# Patient Record
Sex: Female | Born: 1960 | Race: White | Hispanic: No | Marital: Married | Smoking: Never smoker
Health system: Southern US, Community
[De-identification: ages and names within clinical notes are randomized; demographics above are authoritative.]

## PROBLEM LIST (undated history)

## (undated) HISTORY — PX: BREAST BIOPSY: SHX20

---

## 1998-12-13 ENCOUNTER — Other Ambulatory Visit: Admission: RE | Admit: 1998-12-13 | Discharge: 1998-12-13 | Payer: Self-pay | Admitting: Family Medicine

## 2000-04-23 ENCOUNTER — Other Ambulatory Visit: Admission: RE | Admit: 2000-04-23 | Discharge: 2000-04-23 | Payer: Self-pay | Admitting: Family Medicine

## 2001-09-23 ENCOUNTER — Other Ambulatory Visit: Admission: RE | Admit: 2001-09-23 | Discharge: 2001-09-23 | Payer: Self-pay | Admitting: Family Medicine

## 2003-01-16 ENCOUNTER — Other Ambulatory Visit: Admission: RE | Admit: 2003-01-16 | Discharge: 2003-01-16 | Payer: Self-pay | Admitting: Family Medicine

## 2004-06-23 ENCOUNTER — Emergency Department: Payer: Self-pay | Admitting: Unknown Physician Specialty

## 2006-02-19 ENCOUNTER — Ambulatory Visit: Payer: Self-pay | Admitting: Unknown Physician Specialty

## 2006-02-23 ENCOUNTER — Ambulatory Visit: Payer: Self-pay | Admitting: Unknown Physician Specialty

## 2006-10-29 ENCOUNTER — Ambulatory Visit: Payer: Self-pay | Admitting: Internal Medicine

## 2008-06-18 ENCOUNTER — Ambulatory Visit: Payer: Self-pay | Admitting: Obstetrics and Gynecology

## 2008-06-29 ENCOUNTER — Ambulatory Visit: Payer: Self-pay | Admitting: Obstetrics and Gynecology

## 2008-08-28 HISTORY — PX: BREAST EXCISIONAL BIOPSY: SUR124

## 2008-12-28 ENCOUNTER — Ambulatory Visit: Payer: Self-pay | Admitting: Obstetrics and Gynecology

## 2009-01-15 ENCOUNTER — Ambulatory Visit: Payer: Self-pay | Admitting: Surgery

## 2009-02-23 ENCOUNTER — Ambulatory Visit: Payer: Self-pay | Admitting: Surgery

## 2009-03-02 ENCOUNTER — Ambulatory Visit: Payer: Self-pay | Admitting: Surgery

## 2009-07-26 ENCOUNTER — Ambulatory Visit: Payer: Self-pay | Admitting: Gastroenterology

## 2012-04-25 ENCOUNTER — Emergency Department: Payer: Self-pay | Admitting: Unknown Physician Specialty

## 2012-05-02 ENCOUNTER — Emergency Department: Payer: Self-pay | Admitting: Emergency Medicine

## 2012-05-02 LAB — CBC WITH DIFFERENTIAL/PLATELET
Basophil #: 0.1 10*3/uL (ref 0.0–0.1)
Basophil %: 0.8 %
Eosinophil #: 0.3 10*3/uL (ref 0.0–0.7)
Eosinophil %: 2.8 %
HCT: 44.7 % (ref 35.0–47.0)
HGB: 14.4 g/dL (ref 12.0–16.0)
Lymphocyte #: 2.9 10*3/uL (ref 1.0–3.6)
Lymphocyte %: 28.5 %
MCH: 26.6 pg (ref 26.0–34.0)
MCV: 83 fL (ref 80–100)
Monocyte #: 0.6 x10 3/mm (ref 0.2–0.9)
Monocyte %: 6.2 %
Platelet: 248 10*3/uL (ref 150–440)
RBC: 5.4 10*6/uL — ABNORMAL HIGH (ref 3.80–5.20)
WBC: 10.2 10*3/uL (ref 3.6–11.0)

## 2012-05-02 LAB — URINALYSIS, COMPLETE
Blood: NEGATIVE
Ketone: NEGATIVE
Nitrite: NEGATIVE
Ph: 5 (ref 4.5–8.0)
Protein: NEGATIVE
Specific Gravity: 1.014 (ref 1.003–1.030)
WBC UR: 1 /HPF (ref 0–5)

## 2012-05-02 LAB — BASIC METABOLIC PANEL
BUN: 13 mg/dL (ref 7–18)
Calcium, Total: 9.1 mg/dL (ref 8.5–10.1)
Chloride: 104 mmol/L (ref 98–107)
EGFR (Non-African Amer.): >60
Osmolality: 278 (ref 275–301)
Potassium: 3.9 mmol/L (ref 3.5–5.1)
Sodium: 139 mmol/L (ref 136–145)

## 2012-05-02 LAB — CK TOTAL AND CKMB (NOT AT ARMC): CK-MB: 2.7 ng/mL (ref 0.5–3.6)

## 2012-08-31 ENCOUNTER — Emergency Department: Payer: Self-pay | Admitting: Emergency Medicine

## 2012-08-31 LAB — DIFFERENTIAL
Eosinophil #: 0.2 10*3/uL (ref 0.0–0.7)
Lymphocyte #: 2.5 10*3/uL (ref 1.0–3.6)
Monocyte %: 7.4 %
Neutrophil #: 6.5 10*3/uL (ref 1.4–6.5)
Neutrophil %: 64.2 %

## 2012-08-31 LAB — CBC
HCT: 44.1 % (ref 35.0–47.0)
HGB: 14.8 g/dL (ref 12.0–16.0)
MCHC: 33.5 g/dL (ref 32.0–36.0)
Platelet: 240 10*3/uL (ref 150–440)
RBC: 5.47 10*6/uL — ABNORMAL HIGH (ref 3.80–5.20)
RDW: 14.7 % — ABNORMAL HIGH (ref 11.5–14.5)

## 2012-08-31 LAB — BASIC METABOLIC PANEL
Anion Gap: 8 (ref 7–16)
Chloride: 103 mmol/L (ref 98–107)
Creatinine: 0.81 mg/dL (ref 0.60–1.30)
Osmolality: 276 (ref 275–301)

## 2012-08-31 LAB — TROPONIN I: Troponin-I: 0.35 ng/mL — ABNORMAL HIGH

## 2012-08-31 LAB — CK TOTAL AND CKMB (NOT AT ARMC)
CK, Total: 176 U/L (ref 21–215)
CK-MB: 3 ng/mL (ref 0.5–3.6)

## 2012-08-31 LAB — APTT: Activated PTT: 30.9 secs (ref 23.6–35.9)

## 2012-09-09 DIAGNOSIS — I2510 Atherosclerotic heart disease of native coronary artery without angina pectoris: Secondary | ICD-10-CM

## 2012-09-09 DIAGNOSIS — E782 Mixed hyperlipidemia: Secondary | ICD-10-CM

## 2012-09-09 DIAGNOSIS — I10 Essential (primary) hypertension: Secondary | ICD-10-CM

## 2012-09-09 HISTORY — DX: Mixed hyperlipidemia: E78.2

## 2012-09-09 HISTORY — DX: Atherosclerotic heart disease of native coronary artery without angina pectoris: I25.10

## 2012-09-09 HISTORY — DX: Essential (primary) hypertension: I10

## 2014-12-09 ENCOUNTER — Inpatient Hospital Stay
Admit: 2014-12-09 | Disposition: A | Discharge status: Home / Self Care | Payer: Self-pay | Attending: Internal Medicine | Admitting: Internal Medicine

## 2014-12-09 LAB — BASIC METABOLIC PANEL
Anion Gap: 6 — ABNORMAL LOW (ref 7–16)
BUN: 23 mg/dL — ABNORMAL HIGH
CHLORIDE: 106 mmol/L
Calcium, Total: 8.9 mg/dL
Co2: 24 mmol/L
Creatinine: 0.79 mg/dL
EGFR (African American): >60
EGFR (Non-African Amer.): >60
GLUCOSE: 129 mg/dL — AB
Potassium: 4.3 mmol/L
SODIUM: 136 mmol/L

## 2014-12-09 LAB — TROPONIN I
TROPONIN-I: 0.09 ng/mL — AB
TROPONIN-I: 0.8 ng/mL — AB
Troponin-I: 1.72 ng/mL — ABNORMAL HIGH

## 2014-12-09 LAB — CBC
HCT: 43 % (ref 35.0–47.0)
HGB: 13.9 g/dL (ref 12.0–16.0)
MCH: 27.7 pg (ref 26.0–34.0)
MCHC: 32.3 g/dL (ref 32.0–36.0)
MCV: 86 fL (ref 80–100)
Platelet: 209 10*3/uL (ref 150–440)
RBC: 5.01 10*6/uL (ref 3.80–5.20)
RDW: 14.4 % (ref 11.5–14.5)
WBC: 10.1 10*3/uL (ref 3.6–11.0)

## 2014-12-09 LAB — APTT: Activated PTT: 27.4 secs (ref 23.6–35.9)

## 2014-12-09 LAB — HEPARIN LEVEL (UNFRACTIONATED): Anti-Xa(Unfractionated): 0.21 IU/mL — ABNORMAL LOW (ref 0.30–0.70)

## 2014-12-09 LAB — PRO B NATRIURETIC PEPTIDE: B-TYPE NATIURETIC PEPTID: 71 pg/mL

## 2014-12-09 LAB — PROTIME-INR
INR: 1
PROTHROMBIN TIME: 13.6 s

## 2014-12-10 LAB — CBC WITH DIFFERENTIAL/PLATELET
BASOS ABS: 0.1 10*3/uL (ref 0.0–0.1)
BASOS PCT: 0.8 %
Basophil #: 0.1 10*3/uL (ref 0.0–0.1)
Basophil %: 0.6 %
EOS ABS: 0.6 10*3/uL (ref 0.0–0.7)
Eosinophil #: 0.6 10*3/uL (ref 0.0–0.7)
Eosinophil %: 5.2 %
Eosinophil %: 5.8 %
HCT: 40.9 % (ref 35.0–47.0)
HCT: 41.5 % (ref 35.0–47.0)
HGB: 13.4 g/dL (ref 12.0–16.0)
HGB: 13.4 g/dL (ref 12.0–16.0)
LYMPHS ABS: 3.3 10*3/uL (ref 1.0–3.6)
LYMPHS PCT: 31.2 %
Lymphocyte #: 2.8 10*3/uL (ref 1.0–3.6)
Lymphocyte %: 24.2 %
MCH: 27.1 pg (ref 26.0–34.0)
MCH: 27.4 pg (ref 26.0–34.0)
MCHC: 32.8 g/dL (ref 32.0–36.0)
MCHC: 32.2 g/dL (ref 32.0–36.0)
MCV: 84 fL (ref 80–100)
MCV: 84 fL (ref 80–100)
MONO ABS: 0.8 x10 3/mm (ref 0.2–0.9)
Monocyte #: 1 x10 3/mm — ABNORMAL HIGH (ref 0.2–0.9)
Monocyte %: 7.9 %
Monocyte %: 8.8 %
NEUTROS ABS: 5.7 10*3/uL (ref 1.4–6.5)
NEUTROS PCT: 54.3 %
Neutrophil #: 7.1 10*3/uL — ABNORMAL HIGH (ref 1.4–6.5)
Neutrophil %: 61.2 %
Platelet: 211 10*3/uL (ref 150–440)
Platelet: 212 10*3/uL (ref 150–440)
RBC: 4.89 10*6/uL (ref 3.80–5.20)
RBC: 4.92 10*6/uL (ref 3.80–5.20)
RDW: 14.2 % (ref 11.5–14.5)
RDW: 14.4 % (ref 11.5–14.5)
WBC: 10.5 10*3/uL (ref 3.6–11.0)
WBC: 11.6 10*3/uL — ABNORMAL HIGH (ref 3.6–11.0)

## 2014-12-10 LAB — HEPARIN LEVEL (UNFRACTIONATED)
ANTI-XA(UNFRACTIONATED): 0.48 [IU]/mL (ref 0.30–0.70)
Anti-Xa(Unfractionated): 0.4 IU/mL (ref 0.30–0.70)

## 2014-12-27 NOTE — H&P (Signed)
PATIENT NAME:  Denise [name], Denise [name] MR#:  [id number] DATE OF BIRTH:  26-Aug-1961  DATE OF ADMISSION:  12/09/2014  PRIMARY CARE PROVIDER AND CARDIOLOGY: At [facility].   CHIEF COMPLAINT: Chest pain.   HISTORY OF PRESENT ILLNESS: A 54 year old female patient with history of CAD status post PCI at [facility] in 2015, presents to the Emergency Room complaining of acute onset of chest pain and jaw pain. The patient woke up from her sleep around 11:00 p.m. In total took 3 pills of nitroglycerin over 2 hours each with some resolution of chest pain and did not improve, this was worsening and brought her to the Emergency Room here. The patient's troponin was found to be elevated at 0.09 with classic symptoms. The patient is being admitted for NSTEMI. The patient mentions that she had about 70-80% stenosis in 2 blood vessels which were not fixed and medical management was done. On a different vessel where there was 100% blockage the patient had a stent placed.   She does not complain of any nausea, vomiting, sweating. Has had shortness of breath with exertion over a long time.   PAST MEDICAL HISTORY:  1. Hypertension.  2. Hyperlipidemia.  3. CAD status post PCI at [facility].   SOCIAL HISTORY: The patient does not smoke. No alcohol or illicit drugs. Ambulates on her own.   FAMILY HISTORY: No premature CAD in the family. Mother had congestive heart failure.   ALLERGIES: SHELLFISH AND SULFA.   REVIEW OF SYSTEMS: Please see history of presenting illness, rest of systems reviewed and negative.   HOME MEDICATIONS:  1. Pravastatin 20 mg daily.   2. Nitroglycerin 0.3 mg sublingual every 5 minutes as needed.  3. Lisinopril 10 mg daily.  4. Coreg 25 mg b.i.d.  5. Aspirin 81 mg daily.   PHYSICAL EXAMINATION:  VITAL SIGNS: Temperature 97.7, pulse of 58, respirations 20, blood pressure 118/70, saturating 98% on room air.  GENERAL: Obese Caucasian female patient lying in bed.   PSYCHIATRIC;  Alert, oriented x 3,  pleasant.  HEENT: Atraumatic, normocephalic. Oral mucosa moist and pink. External ears and nose normal. No pallor. No icterus. Pupils are bilaterally equal and reactive to light.  NECK: Supple. No thyromegaly. No palpable lymph nodes. Trachea midline. No bruits or JVD.  CARDIOVASCULAR: S1, S2, without any murmurs. No chest wall tenderness.   RESPIRATORY: Normal work of breathing. Clear to auscultation on both sides.   GASTROINTESTINAL:  Soft abdomen, nontender. Bowel sounds present. No hepatosplenomegaly palpable.  SKIN: Warm and dry. No petechiae, rash, ulcers.  MUSCULOSKELETAL: No joint swelling, redness, effusion of the large joints.  NEUROLOGICAL: Motor strength 5 out of 5 in upper extremities.   LABORATORY STUDIES: Showed troponin 0.09 with a glucose of 129. BNP 71. BUN 23, creatinine 0.79, and potassium 4.3. Hemoglobin 13.9 with INR of 1. EKG shows normal sinus rhythm with old septal infarct and inferior infarct, nothing acute.   Chest x-ray portable showed no acute issues.   ASSESSMENT AND PLAN:  1.  Non-ST segment elevation myocardial infarction in a patient with typical chest pain, elevated troponin. The patient will be admitted on the telemetry floor. She is critically ill and needs close monitoring with vitals q. 4 hours. We will put her on a heparin drip along with nitroglycerin p.r.n., continue her beta blocker, hold ACE inhibitor due to contrast load from the catheterization which is scheduled for tomorrow as discussed with Dr. Darrold Junker. High risk for arrhythmias, cardiac arrest, and will be on telemetry floor.  Continue aspirin and statin.  2.  Hypertension. Continue medications.  3.  Deep vein thrombosis prophylaxis. The patient is on a heparin drip.   CODE STATUS: Full code.   TIME SPENT ON THIS CASE: 40 minutes.    ____________________________ Molinda BailiffSrikar R. Henry Demeritt, MD srs:bu D: 12/09/2014 15:38:00 ET T: 12/09/2014 16:10:08 ET JOB#: [id number]  cc: Wardell HeathSrikar R. Darrion Macaulay, MD,  <Dictator> Orie FishermanSRIKAR R Monty Spicher MD ELECTRONICALLY SIGNED 12/21/2014 11:18

## 2014-12-27 NOTE — Consult Note (Signed)
PATIENT NAME:  Denise [name], Olivine H MR#:  [id number] DATE OF BIRTH:  1960/12/06  DATE OF CONSULTATION:  12/09/2014  REFERRING PHYSICIAN:  Santina Evansatherine P. Clent RidgesWalsh, MD CONSULTING PHYSICIAN:  Marcina MillardAlexander Anyla Israelson, MD  CARDIOLOGIST: Raynelle JanMatthew Roe, M.D. at [facility]  CHIEF COMPLAINT: Chest pain.   REASON FOR CONSULTATION: Consultation requested for evaluation of chest pain and elevated troponin.   HISTORY OF PRESENT ILLNESS: The patient is a 54 year old female with history of hypertension and coronary artery disease. She is status post inferior STEMI and primary PCI with drug-eluting stent in distal RCA January 2014. The patient reports that she was in her usual state of health until last evening when she experienced headache for which he took 2 sublingual nitroglycerin. She went to bed and awoke later that evening with persistent headache and then noted pain in her jaw and eventually chest discomfort. Chest pain and jaw pain became worse and she presented to [facility] Emergency Room where initial EKG was nondiagnostic.  Admission labs were notable for elevated troponin of 0.09. Followup troponin was 0.80.  The patient reports the chest and jaw pain are somewhat improved.   PAST MEDICAL HISTORY:  1.  Status post inferior STEMI and drug-eluting stent distal RCA with 90% stenosis in the distal segment of the LAD, 70% stenosis in the proximal, and diffuse 60 to 80% stenosis in the mid segment of the left circumflex, and 60% stenosis ostium of the RCA.  2.  Hypertension  3.  Hyperlipidemia.   MEDICATIONS ON ADMISSION: Pravastatin 20 mg daily, lisinopril 10 mg daily, carvedilol 25 mg b.i.d., nitroglycerin sublingual p.r.n., aspirin 81 mg daily.   SOCIAL HISTORY: The patient is married. She denies tobacco abuse.   FAMILY HISTORY: No immediate family history of coronary artery disease or myocardial infarction,  REVIEW OF SYSTEMS: CONSTITUTIONAL: No fever or chills.  EYES: No blurry vision.  EARS: No hearing loss.   RESPIRATORY: No shortness of breath.  CARDIOVASCULAR: Chest pain as described above.  GASTROINTESTINAL: The patient has some mild nausea.  GENITOURINARY: No dysuria or hematuria.  ENDOCRINE: No polyuria or polydipsia.  MUSCULOSKELETAL: No arthralgias or myalgias.  NEUROLOGICAL: No focal muscle weakness or numbness.  PSYCHOLOGICAL: No depression or anxiety.   PHYSICAL EXAMINATION:  VITAL SIGNS: Blood pressure 118/70, pulse 58, respirations 20, temperature 97.7, pulse oximetry 98%.  HEENT: Pupils equal and reactive to light and accommodation.  NECK: Supple without thyromegaly.  LUNGS: Clear.  CARDIOVASCULAR: Normal JVP. Normal PMI., Regular rate and rhythm.  Normal S1, S2. No appreciable gallop, murmur, or rub.  ABDOMEN: Soft and nontender.  EXTREMITIES:  Pulses were intact bilaterally.  MUSCULOSKELETAL: Normal muscle tone.  NEUROLOGIC: The patient is alert and oriented x 3. Motor and sensory both grossly intact.   IMPRESSION: A 54 year old female with known coronary artery disease, history of inferior ST-elevation myocardial infarction and drug-eluting stent in distal right coronary artery with residual disease of the left circumflex and left anterior descending. The patient presents with chest pain and jaw pain consistent with unstable angina with borderline elevated troponin.   RECOMMENDATIONS:  1.  Agree with overall current therapy.  2.  Continue heparin drip.  3.  Proceed with cardiac catheterization with selective coronary arteriography in the a.m.  Risks, benefits, and alternatives were explained and informed written consent obtained.    ____________________________ Marcina MillardAlexander Binta Statzer, MD ap:sp D: 12/09/2014 16:17:13 ET T: 12/09/2014 16:36:29 ET JOB#: [id number]  cc: Marcina MillardAlexander Mikenzie Mccannon, MD, <Dictator> Marcina MillardALEXANDER Simya Tercero MD ELECTRONICALLY SIGNED 12/22/2014 17:17

## 2014-12-27 NOTE — Discharge Summary (Signed)
PATIENT NAME:  Denise Denise [name], Denise Denise [name] MR#:  [id number] DATE OF BIRTH:  10-07-1960  DATE OF ADMISSION:  12/09/2014 DATE OF DISCHARGE:  12/10/2014  DISCHARGE DIAGNOSES: 1.  Non-ST elevation myocardial infarction with multivessel disease.  2.  Hypertension.   CONSULTATIONS: Dr. Darrold JunkerParaschos with cardiology.   PROCEDURES: Cardiac catheterization, which showed two-vessel CAD with 75% stenosis of distal LAD 75% stenosis, ostium medium, 75% stenosis of ramus, occluded proximal left circumflex, patent stent in the proximal RCA with 60% stenosis of the ostium of RCA.   IMAGING STUDIES: Include an echocardiogram, which showed ejection fraction of 55% to 60% with no significant valvular abnormalities.   ADMITTING HISTORY AND PHYSICAL AND HOSPITAL COURSE: Please see the detailed Denise [name] and P dictated previously. In brief, a 54 year old female patient was admitted to the hospitalist service with typical chest pain concerning for unstable angina. She was also found to have elevated troponin, diagnosed with non- STEMI. Was taken to the cardiac catheter lab by Dr. Darrold JunkerParaschos which showed multivessel disease, but was thought to be a candidate for medical management. Prior to discharge, the patient is doing well, chest pain-free. No shortness of breath. She was seen by Dr. Darrold JunkerParaschos, cleared for discharge, and is being discharged home in a stable condition.   Prior to discharge, the patient's lungs sound clear. S1, S2 heard.   DISCHARGE MEDICATIONS: 1.  Aspirin 81 mg daily.  2.  Plavix 75 mg daily.  3.  Coreg 25 mg 2 times a day.  4.  Lisinopril 10 mg daily.  5.  Pravastatin 20 mg daily.  6.  Nitroglycerin 0.3 sublingual as needed for chest pain.  7.  Isosorbide mononitrate 30 mg extended-release daily.   DISCHARGE INSTRUCTIONS: Low-sodium, low-fat diet. Activity as tolerated. Follow up with Dr. Darrold JunkerParaschos in 1 week. Cardiac referral for MI has been done.   TIME SPENT ON DAY OF DISCHARGE IN DISCHARGE ACTIVITY: 40  minutes.    ____________________________ Molinda BailiffSrikar R. Lessly Stigler, MD srs:at D: 12/11/2014 16:10:51 ET T: 12/11/2014 17:21:39 ET JOB#: [id number]  cc: Wardell HeathSrikar R. Orin Eberwein, MD, <Dictator> Marcina MillardAlexander Paraschos, MD Orie FishermanSRIKAR R Lyndi Holbein MD ELECTRONICALLY SIGNED 12/21/2014 11:19

## 2018-04-08 HISTORY — DX: Morbid (severe) obesity due to excess calories: E66.01

## 2018-04-09 DIAGNOSIS — E119 Type 2 diabetes mellitus without complications: Secondary | ICD-10-CM

## 2018-04-09 HISTORY — DX: Type 2 diabetes mellitus without complications: E11.9

## 2019-01-01 ENCOUNTER — Telehealth: Payer: Self-pay

## 2019-01-01 NOTE — Telephone Encounter (Signed)
Called patient.  No answer. LMOV.  Need to change to a telehealth visit.

## 2019-01-10 ENCOUNTER — Ambulatory Visit: Payer: Self-pay | Admitting: Cardiovascular Disease

## 2019-06-20 ENCOUNTER — Telehealth: Payer: Self-pay

## 2019-06-20 NOTE — Telephone Encounter (Signed)
Pre-visit screening call attempted prior to [facility] appointment on 06/24/2019. No answer / left a msg 

## 2019-06-24 ENCOUNTER — Ambulatory Visit: Payer: Self-pay | Attending: Oncology | Admitting: *Deleted

## 2019-06-24 ENCOUNTER — Ambulatory Visit
Admission: RE | Admit: 2019-06-24 | Discharge: 2019-06-24 | Disposition: A | Discharge status: Home / Self Care | Payer: Self-pay | Source: Ambulatory Visit | Attending: Oncology | Admitting: Oncology

## 2019-06-24 ENCOUNTER — Encounter: Payer: Self-pay | Admitting: *Deleted

## 2019-06-24 ENCOUNTER — Other Ambulatory Visit: Payer: Self-pay

## 2019-06-24 VITALS — BP 155/93 | HR 66 | Temp 98.9°F | Ht 64.0 in | Wt 278.4 lb

## 2019-06-24 DIAGNOSIS — Z0000 Encounter for general adult medical examination without abnormal findings: Secondary | ICD-10-CM | POA: Insufficient documentation

## 2019-06-24 NOTE — Progress Notes (Signed)
  Subjective:     Patient ID: Denise [name], female   DOB: 21-Apr-1961, 58 y.o.   MRN: [id number]  HPI   Review of Systems     Objective:   Physical Exam Chest:     Breasts:        Right: No swelling, bleeding, inverted nipple, mass, nipple discharge, skin change or tenderness.        Left: No swelling, bleeding, inverted nipple, mass, nipple discharge, skin change or tenderness.  Lymphadenopathy:     Upper Body:     Right upper body: No supraclavicular or axillary adenopathy.     Left upper body: No supraclavicular or axillary adenopathy.        Assessment:     58 year old White female presents to [facility] for clinical breast exam and mammogram only.  Clinical breast exam unremarkable.  Patient with a history of right breast biopsy in 2010 with fibrocystic changes and no atypia.  Taught self breast awareness.  Last pap on 9/20 at [facility] was negative / without HPV co-testing.  Next pap due in 2023.   Risk Assessment    Risk Scores      06/24/2019   Last edited by: Theodore Demark, RN   5-year risk: 1.2 %   Lifetime risk: 6.3 %            Plan:     Screening mammogram ordered.  Will follow up per [facility] protocol.

## 2019-06-24 NOTE — Patient Instructions (Signed)
Gave patient hand-out, Women Staying Healthy, Active and Well from [facility], with education on breast health, pap smears, heart and colon health. 

## 2019-06-25 ENCOUNTER — Encounter: Payer: Self-pay | Admitting: *Deleted

## 2019-10-07 DIAGNOSIS — Z789 Other specified health status: Secondary | ICD-10-CM

## 2019-10-07 DIAGNOSIS — R0609 Other forms of dyspnea: Secondary | ICD-10-CM | POA: Insufficient documentation

## 2019-10-07 DIAGNOSIS — R0600 Dyspnea, unspecified: Secondary | ICD-10-CM

## 2019-10-07 HISTORY — DX: Other specified health status: Z78.9

## 2019-10-07 HISTORY — DX: Other forms of dyspnea: R06.09

## 2019-10-07 HISTORY — DX: Dyspnea, unspecified: R06.00

## 2020-01-19 IMAGING — MG DIGITAL SCREENING BILAT W/ TOMO
6 of 10 series · 6 of 30 positions shown · non-contrast
Comparison: Previous exam(s).

CLINICAL DATA: Screening.

EXAM:
DIGITAL SCREENING BILATERAL MAMMOGRAM WITH TOMO AND CAD

[R MLO synth-2D (1 of 2)]
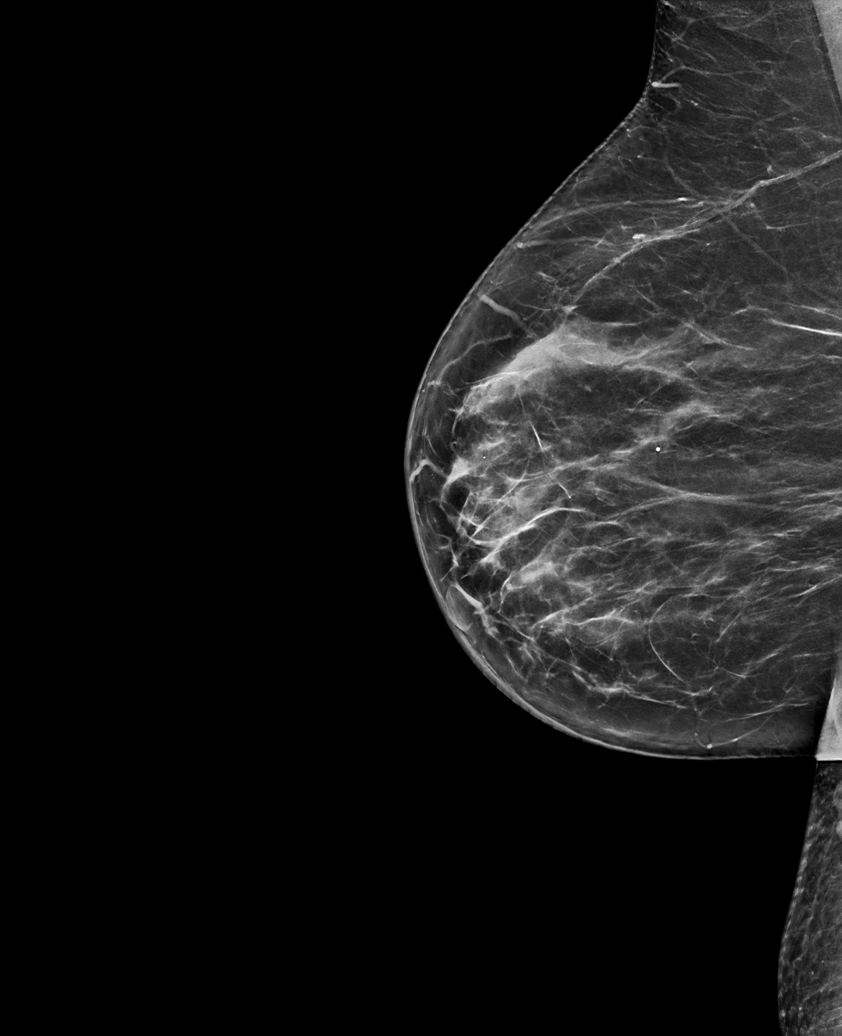

[R CC synth-2D]
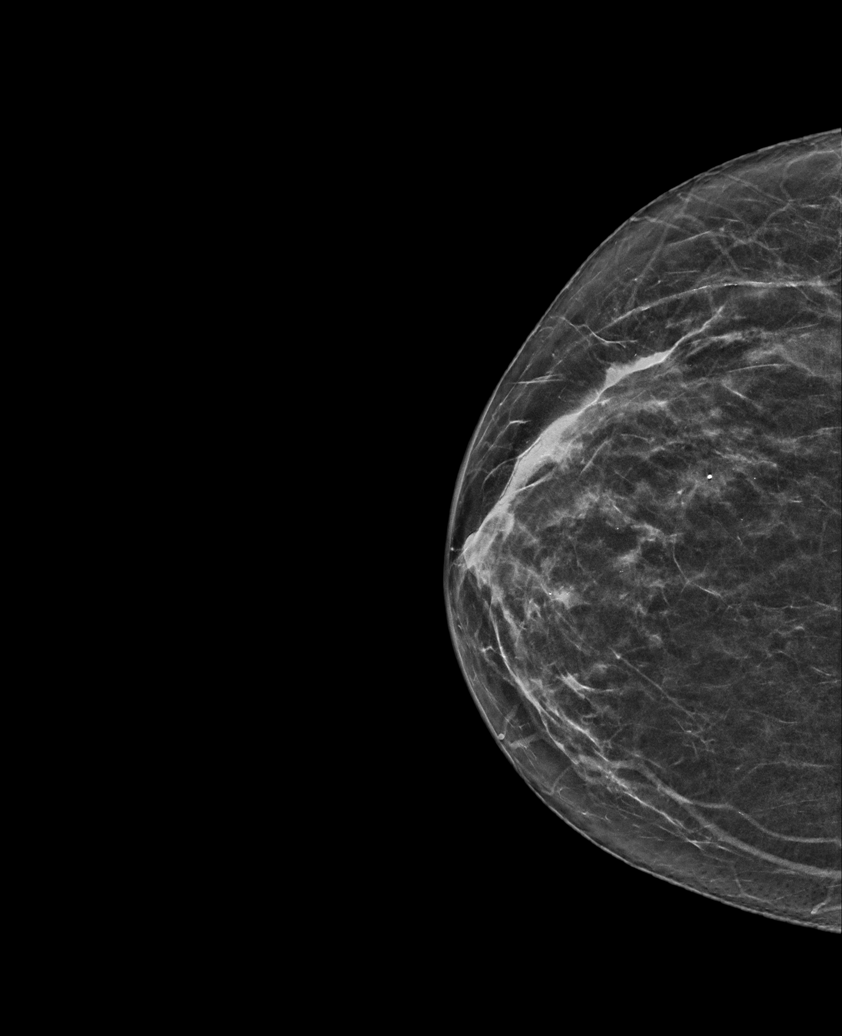

[L CC synth-2D]
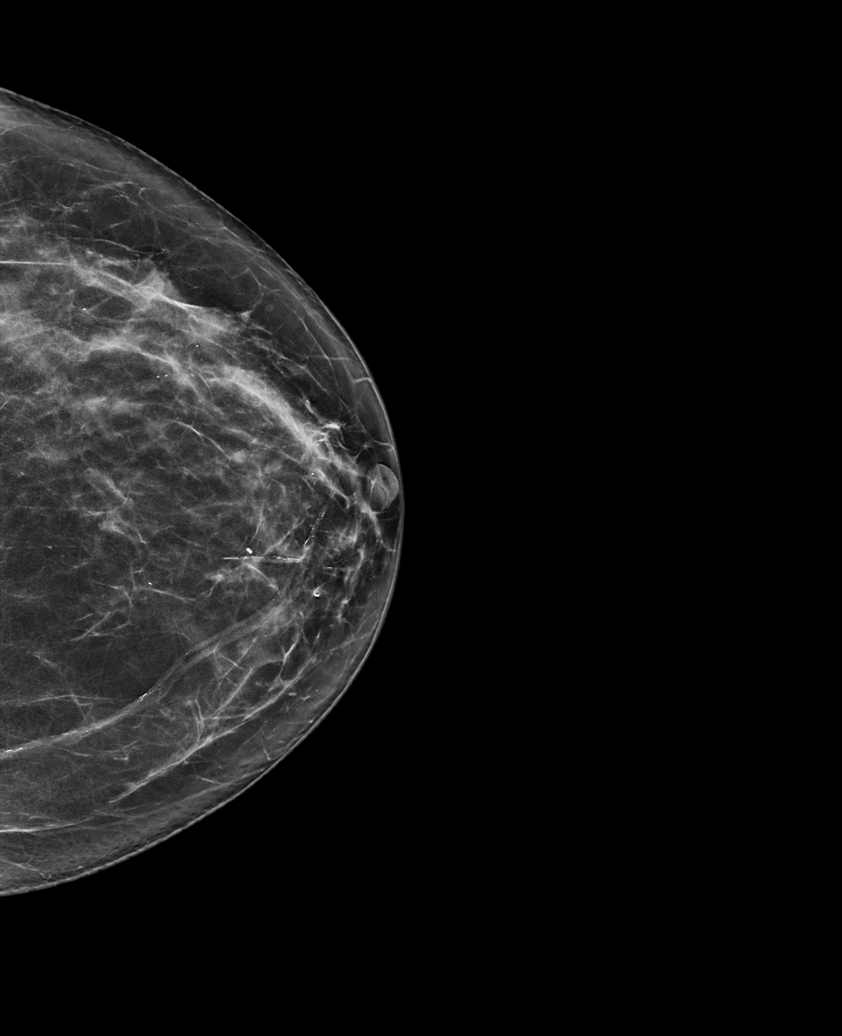

[R MLO synth-2D (2 of 2)]
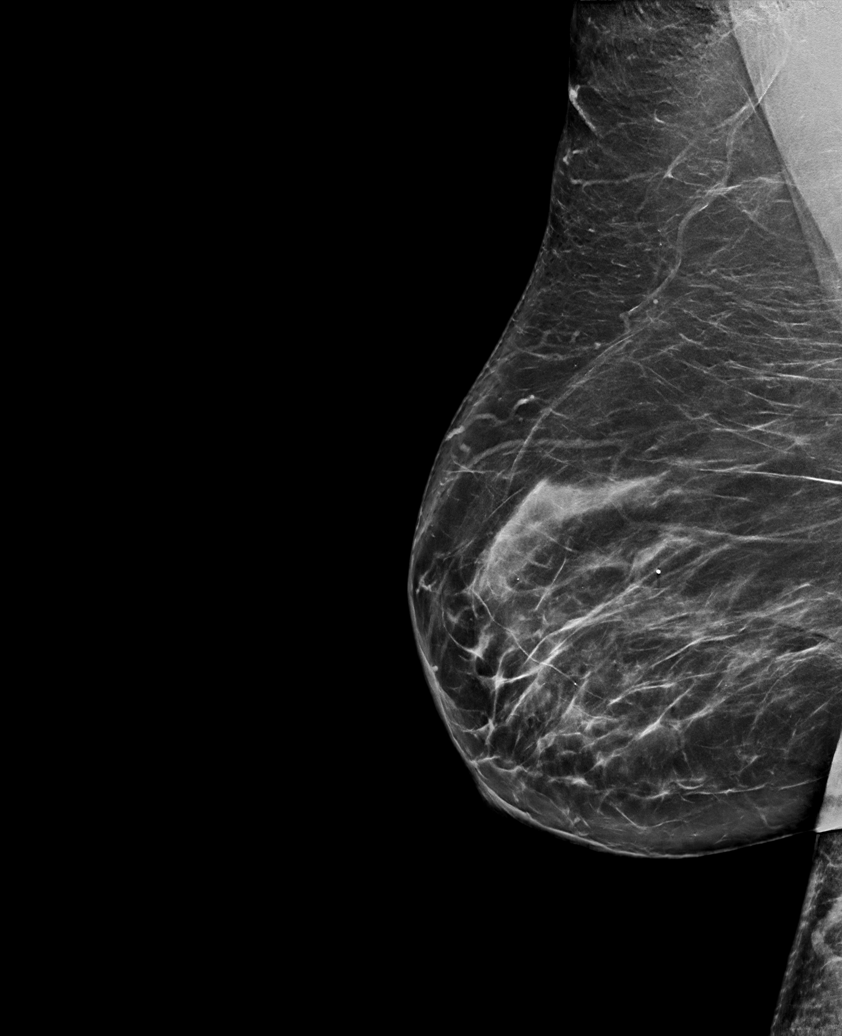

[L MLO synth-2D]
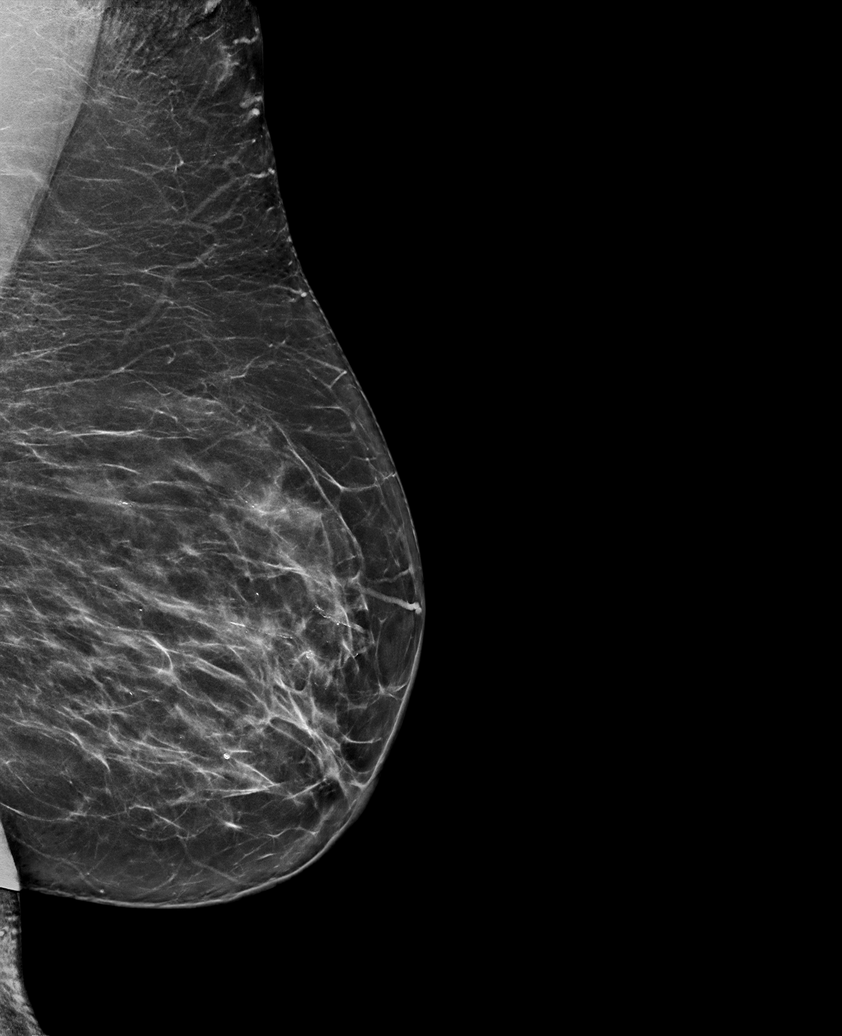

[L CC tomo · tomo slice 37/74.0]
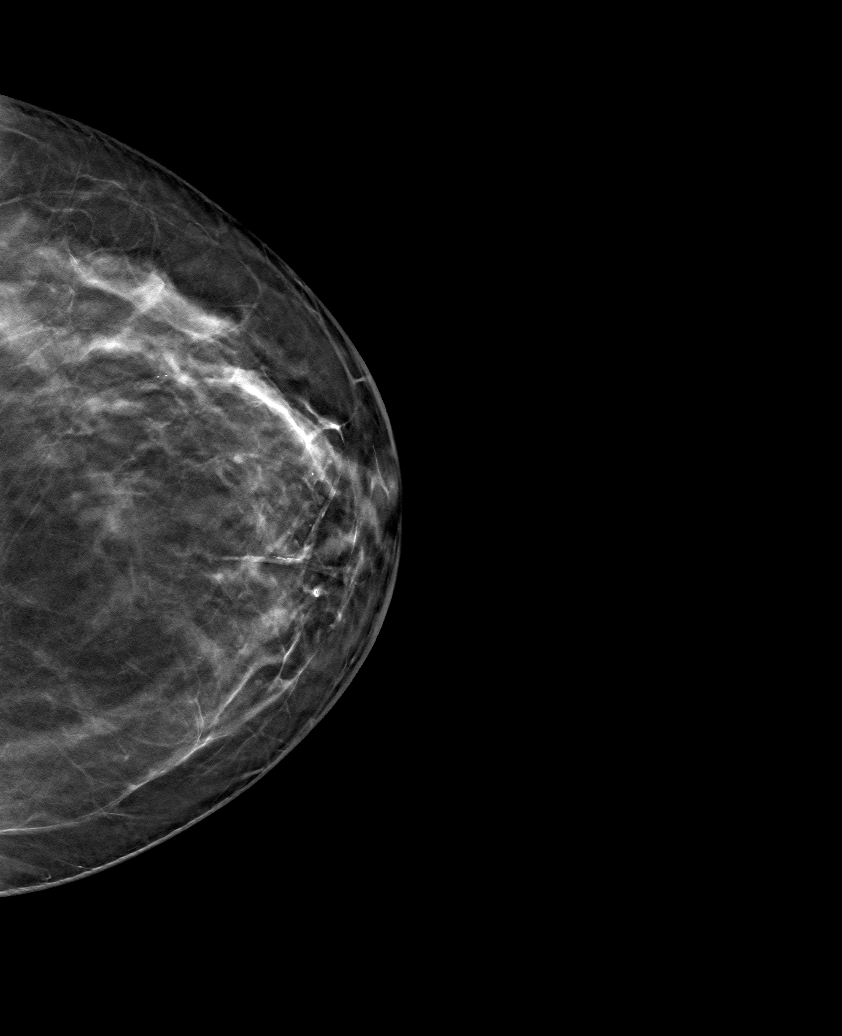

[6 of 30 positions shown; findings below may reference images not displayed]

ACR Breast Density Category b: There are scattered areas of
fibroglandular density.
FINDINGS: There are no findings suspicious for malignancy. Images were
processed with CAD.
IMPRESSION: No mammographic evidence of malignancy. A result letter of this
screening mammogram will be mailed directly to the patient.

RECOMMENDATION:
Screening mammogram in one year. (Code:[id number])

BI-RADS CATEGORY  1: Negative.

## 2020-09-11 ENCOUNTER — Other Ambulatory Visit: Payer: Self-pay

## 2020-10-07 ENCOUNTER — Encounter: Payer: Self-pay | Admitting: Cardiovascular Disease

## 2020-10-07 NOTE — Progress Notes (Unsigned)
Cardiology Office Note  Date:  10/08/2020   ID:  Denise [name], DOB [birth year], MRN [id number]  PCP:  Denise Paradise, MD   Chief Complaint  Patient presents with  . New Patient (Initial Visit)    Self referral  States she is changing cardiologists--needs to see one closer to home, states husband comes here    HPI:  Denise [name] is a 60 y.o. female with hx of DM, HTN, hyperlipidemia, obesity, and CAD s/p inferior STEMI on 09/10/12 (PCI with BMS to [facility] and BMS to RPAV) and NSTEMI on 12/10/14 managed medically who presents for new patient evaluation of his CAD   1. Borderline DM 2. HTN 3. Hyperlipidemia 4. Obesity 5. CAD s/p inferior STEMI on 08/31/12 A. Presented with sudden onset 10/10 sharp back pain between the clavicles and extending to the chest as 10/10 chest pressure, bilateral arm numbness, left jaw pain, nausea, vomiting. She presented to an OSH and was noted to have inferior ST elevations and was transferred to [facility]. B. LHC 08/31/12:  -RCA p60%, p100%, RPAV 99%; LCx OM1 70%; RI 40%, 70%; LAD m30%, d90%; LM normal;  -PCI to the mRCA with BMS (CORONARY INTEGRITY RX 4.00X15MM) -PCI to the RPAV with BMS (CORONARY INTEGRITY RX 2.25X12MM) C. 08/31/12: CK 107 to 123 to 95; Trop T 2.48 to 2.88 09/01/13: CK 74; Trop T 2.02 6. NSTEMI on 12/09/14 A. Presented with typical chest pain, non-diagnostic ECG, and elevated troponin I (0.09 to 0.80 to 1.72 [0.00-0.03 ng/ml]) B. LHC 12/10/14: LVEF 63%; Rt dominant; LAD m40%; d75%; D1 ostial 80%; LCx p100%; RI 75%; RCA ostial 60%; RPDA 40% C. She was treated medically   SOB with heavy exertion, Not taking NTG BP at home 120 to 130 systolic 60 to 80 diastolic  does not want statin, unclear if myalgias On zetia Former cardiology tried pcsk9 , not interested at this time  EKG personally reviewed by myself on todays visit NSR rate 78  Bpm, consider old anterior MI   PMH:   has a past medical history of CAD (coronary artery disease)  (09/09/2012), DOE (dyspnea on exertion) (10/07/2019), Essential hypertension (09/09/2012), Mixed hyperlipidemia (09/09/2012), Obesity, morbid (HCC) (04/08/2018), Statin intolerance (10/07/2019), and Type 2 diabetes mellitus (HCC) (04/09/2018).  PSH:    Past Surgical History:  Procedure Laterality Date  . BREAST BIOPSY      Current Outpatient Medications  Medication Sig Dispense Refill  . aspirin 81 MG EC tablet Take by mouth.    . carvedilol (COREG) 25 MG tablet Take by mouth.    . Coenzyme Q10 10 MG capsule Take by mouth.    . cyanocobalamin 1000 MCG tablet Take by mouth.    . ezetimibe (ZETIA) 10 MG tablet Take 1 tablet by mouth daily.    . isosorbide mononitrate (IMDUR) 30 MG 24 hr tablet Take by mouth.    . Multiple Vitamin (MULTI-VITAMIN) tablet Take 1 tablet by mouth daily.    . nitroGLYCERIN (NITROSTAT) 0.4 MG SL tablet Place 1 tablet (0.4 mg total) under the tongue every 5 (five) minutes as needed for chest pain. 25 tablet 3  . Omega-3 Fatty Acids (OMEGA-3 2100) 1050 MG CAPS Take 1 tablet by mouth daily.    . valsartan (DIOVAN) 80 MG tablet Take by mouth.     No current facility-administered medications for this visit.     Allergies:   Patient has no allergy information on record.   Social History:  The patient  reports that she has never  smoked. She has never used smokeless tobacco. She reports current alcohol use of about 1.0 standard drink of alcohol per week. She reports that she does not use drugs.   Family History:   family history includes Breast cancer in her maternal aunt.    Review of Systems: Review of Systems  Constitutional: Negative.   HENT: Negative.   Respiratory: Negative.   Cardiovascular: Negative.   Gastrointestinal: Negative.   Musculoskeletal: Negative.   Neurological: Negative.   Psychiatric/Behavioral: Negative.   All other systems reviewed and are negative.   PHYSICAL EXAM: VS:  BP (!) 152/102   Pulse 78   Ht 5\' 4"  (1.626 m)   Wt 269 lb (122  kg)   BMI 46.17 kg/m  , BMI Body mass index is 46.17 kg/m. GEN: Well nourished, well developed, in no acute distress HEENT: normal Neck: no JVD, carotid bruits, or masses Cardiac: RRR; no murmurs, rubs, or gallops,no edema  Respiratory:  clear to auscultation bilaterally, normal work of breathing GI: soft, nontender, nondistended, + BS MS: no deformity or atrophy Skin: warm and dry, no rash Neuro:  Strength and sensation are intact Psych: euthymic mood, full affect   Recent Labs: No results found for requested labs within last 8760 hours.    Lipid Panel No results found for: CHOL, HDL, LDLCALC, TRIG    Wt Readings from Last 3 Encounters:  10/08/20 269 lb (122 kg)  06/24/19 278 lb 6.4 oz (126.3 kg)      ASSESSMENT AND PLAN:  Problem List Items Addressed This Visit      Cardiology Problems   CAD (coronary artery disease) - Primary   Relevant Medications   aspirin 81 MG EC tablet   carvedilol (COREG) 25 MG tablet   ezetimibe (ZETIA) 10 MG tablet   isosorbide mononitrate (IMDUR) 30 MG 24 hr tablet   valsartan (DIOVAN) 80 MG tablet   nitroGLYCERIN (NITROSTAT) 0.4 MG SL tablet   Other Relevant Orders   EKG 12-Lead   Essential hypertension   Relevant Medications   aspirin 81 MG EC tablet   carvedilol (COREG) 25 MG tablet   ezetimibe (ZETIA) 10 MG tablet   isosorbide mononitrate (IMDUR) 30 MG 24 hr tablet   valsartan (DIOVAN) 80 MG tablet   nitroGLYCERIN (NITROSTAT) 0.4 MG SL tablet   Other Relevant Orders   EKG 12-Lead   Mixed hyperlipidemia   Relevant Medications   aspirin 81 MG EC tablet   carvedilol (COREG) 25 MG tablet   ezetimibe (ZETIA) 10 MG tablet   isosorbide mononitrate (IMDUR) 30 MG 24 hr tablet   valsartan (DIOVAN) 80 MG tablet   nitroGLYCERIN (NITROSTAT) 0.4 MG SL tablet    Other Visit Diagnoses    Myalgia due to statin         Cad with stable angian Currently with no symptoms of angina. No further workup at this time. Continue current  medication regimen. NTG refilled  Morbid obesity We have encouraged continued exercise, careful diet management in an effort to lose weight.  Hyperlipidemia On zetia Total chol 157 \\does  not want statin, possible myalgias  Stress Mom with dementia, lives with them  HTN Monitors at home Well control per numbers today  Shortness of breath Likely multifactorial, recommended weight loss, walking program for conditioning  Long discussion concerning her prior cardiac history, numerous catheterizations, stent placement, chronic shortness of breath  Total encounter time more than 60 minutes  Greater than 50% was spent in counseling and coordination of care with the  patient    Signed, Esmond Plants, M.D., Ph.D. [place], [place]

## 2020-10-08 ENCOUNTER — Ambulatory Visit (INDEPENDENT_AMBULATORY_CARE_PROVIDER_SITE_OTHER): Payer: Self-pay | Admitting: Cardiovascular Disease

## 2020-10-08 ENCOUNTER — Other Ambulatory Visit: Payer: Self-pay

## 2020-10-08 ENCOUNTER — Encounter: Payer: Self-pay | Admitting: Cardiovascular Disease

## 2020-10-08 VITALS — BP 152/102 | HR 78 | Ht 64.0 in | Wt 269.0 lb

## 2020-10-08 DIAGNOSIS — I25118 Atherosclerotic heart disease of native coronary artery with other forms of angina pectoris: Secondary | ICD-10-CM

## 2020-10-08 DIAGNOSIS — E782 Mixed hyperlipidemia: Secondary | ICD-10-CM

## 2020-10-08 DIAGNOSIS — M791 Myalgia, unspecified site: Secondary | ICD-10-CM

## 2020-10-08 DIAGNOSIS — T466X5A Adverse effect of antihyperlipidemic and antiarteriosclerotic drugs, initial encounter: Secondary | ICD-10-CM

## 2020-10-08 DIAGNOSIS — I10 Essential (primary) hypertension: Secondary | ICD-10-CM

## 2020-10-08 MED ORDER — NITROGLYCERIN 0.4 MG SL SUBL: 0.4000 mg | SUBLINGUAL_TABLET | SUBLINGUAL | 3 refills | Status: DC | PRN

## 2020-10-08 NOTE — Patient Instructions (Addendum)
Medication Instructions:  No changes  If you need a refill on your cardiac medications before your next appointment, please call your pharmacy.    Lab work: No new labs needed   If you have labs (blood work) drawn today and your tests are completely normal, you will receive your results only by: . MyChart Message (if you have MyChart) OR . A paper copy in the mail If you have any lab test that is abnormal or we need to change your treatment, we will call you to review the results.   Testing/Procedures: No new testing needed   Follow-Up: At [facility], you and your health needs are our priority.  As part of our continuing mission to provide you with exceptional heart care, we have created designated Provider Care Teams.  These Care Teams include your primary Cardiologist (physician) and Advanced Practice Providers (APPs -  Physician Assistants and Nurse Practitioners) who all work together to provide you with the care you need, when you need it.  . You will need a follow up appointment in 6 months  . Providers on your designated Care Team:   . Christopher Berge, NP . Ryan Dunn, PA-C . Jacquelyn Visser, PA-C  Any Other Special Instructions Will Be Listed Below (If Applicable).  COVID-19 Vaccine Information can be found at: [email or web address] For questions related to vaccine distribution or appointments, please email [email or web address] or call [phone].     

## 2020-11-24 ENCOUNTER — Other Ambulatory Visit: Payer: Self-pay

## 2020-11-24 MED ORDER — CARVEDILOL 25 MG PO TABS: 25.0000 mg | ORAL_TABLET | Freq: Two times a day (BID) | ORAL | 3 refills | Status: DC

## 2020-11-24 NOTE — Telephone Encounter (Signed)
Requested Prescriptions   Signed Prescriptions Disp Refills  . carvedilol (COREG) 25 MG tablet 180 tablet 3    Sig: Take 1 tablet (25 mg total) by mouth 2 (two) times daily with a meal.    Authorizing Provider: Antonieta Iba    Ordering User: Margrett Rud

## 2021-04-11 NOTE — Progress Notes (Signed)
Cardiology Office Note  Date:  04/12/2021   ID:  DEEKSHA COTRELL, DOB 1960/12/29, MRN [id number]  PCP:  Patrice Paradise, MD   Chief Complaint  Patient presents with   6 month follow up     Patient c/o shortness of breath with over exertion and cramping in legs. Medications reviewed by the patient verbally.     HPI:  Denise [name] is a 60 y.o. female with hx of DM, HTN, hyperlipidemia, obesity, and CAD s/p inferior STEMI on 09/10/12 (PCI with BMS to [facility] and BMS to RPAV) and NSTEMI on 12/10/14 managed medically who presents for follow-up of her CAD  Other past medical history as below 1. Borderline DM 2. HTN 3. Hyperlipidemia 4. Obesity 5. CAD s/p inferior STEMI on 08/31/12 A. Presented with sudden onset 10/10 sharp back pain between the clavicles and extending to the chest as 10/10 chest pressure, bilateral arm numbness, left jaw pain, nausea, vomiting. She presented to an OSH and was noted to have inferior ST elevations and was transferred to [facility]. B. LHC 08/31/12:  -RCA p60%, p100%, RPAV 99%; LCx OM1 70%; RI 40%, 70%; LAD m30%, d90%; LM normal;  -PCI to the mRCA with BMS (CORONARY INTEGRITY RX 4.00X15MM) -PCI to the RPAV with BMS (CORONARY INTEGRITY RX 2.25X12MM) C. 08/31/12: CK 107 to 123 to 95; Trop T 2.48 to 2.88 09/01/13: CK 74; Trop T 2.02 6. NSTEMI on 12/09/14 A. Presented with typical chest pain, non-diagnostic ECG, and elevated troponin I (0.09 to 0.80 to 1.72 [0.00-0.03 ng/ml]) B. LHC 12/10/14: LVEF 63%; Rt dominant; LAD m40%; d75%; D1 ostial 80%; LCx p100%; RI 75%; RCA ostial 60%; RPDA 40% C. She was treated medically  In follow-up today reports that she continues to have chronic shortness of breath with activity such as working in her garden Feels this is from deconditioning BP ok at home Forgets PM coreg Husband who presents with her today reports blood pressure will be much better but she does not take some of her medications No NTG use, denies angina  Weight still  running high, trouble with her diet  Again reiterated with her she does not want statin, does not want PCSK9 inhibitor Maintained on Zetia Happy with cholesterol in the 150 up to 160 range  EKG personally reviewed by myself on todays visit NSR rate 78  Bpm, consider old anterior MI   PMH:   has a past medical history of CAD (coronary artery disease) (09/09/2012), DOE (dyspnea on exertion) (10/07/2019), Essential hypertension (09/09/2012), Mixed hyperlipidemia (09/09/2012), Obesity, morbid (HCC) (04/08/2018), Statin intolerance (10/07/2019), and Type 2 diabetes mellitus (HCC) (04/09/2018).  PSH:    Past Surgical History:  Procedure Laterality Date   BREAST BIOPSY      Current Outpatient Medications  Medication Sig Dispense Refill   aspirin 81 MG EC tablet Take by mouth.     carvedilol (COREG) 25 MG tablet Take 1 tablet (25 mg total) by mouth 2 (two) times daily with a meal. 180 tablet 3   Coenzyme Q10 10 MG capsule Take by mouth.     cyanocobalamin 1000 MCG tablet Take by mouth.     ezetimibe (ZETIA) 10 MG tablet Take 1 tablet by mouth daily.     isosorbide mononitrate (IMDUR) 30 MG 24 hr tablet Take by mouth.     Multiple Vitamin (MULTI-VITAMIN) tablet Take 1 tablet by mouth daily.     nitroGLYCERIN (NITROSTAT) 0.4 MG SL tablet Place 1 tablet (0.4 mg total) under the tongue every 5 (  five) minutes as needed for chest pain. 25 tablet 3   Omega-3 Fatty Acids (OMEGA-3 2100) 1050 MG CAPS Take 1 tablet by mouth daily.     valsartan (DIOVAN) 80 MG tablet Take by mouth.     No current facility-administered medications for this visit.     Allergies:   Lisinopril and Sulfa antibiotics   Social History:  The patient  reports that she has never smoked. She has never used smokeless tobacco. She reports current alcohol use of about 1.0 standard drink per week. She reports that she does not use drugs.   Family History:   family history includes Breast cancer in her maternal aunt.    Review of  Systems: Review of Systems  Constitutional: Negative.   HENT: Negative.    Respiratory: Negative.    Cardiovascular: Negative.   Gastrointestinal: Negative.   Musculoskeletal: Negative.   Neurological: Negative.   Psychiatric/Behavioral: Negative.    All other systems reviewed and are negative.  PHYSICAL EXAM: VS:  BP (!) 150/90 (BP Location: Left Arm, Patient Position: Sitting, Cuff Size: Large)   Pulse 64   Ht 5\' 5"  (1.651 m)   Wt 277 lb (125.6 kg)   SpO2 97%   BMI 46.10 kg/m  , BMI Body mass index is 46.1 kg/m. Constitutional:  oriented to person, place, and time. No distress.  Obese HENT:  Head: Grossly normal Eyes:  no discharge. No scleral icterus.  Neck: No JVD, no carotid bruits  Cardiovascular: Regular rate and rhythm, no murmurs appreciated Pulmonary/Chest: Clear to auscultation bilaterally, no wheezes or rails Abdominal: Soft.  no distension.  no tenderness.  Musculoskeletal: Normal range of motion Neurological:  normal muscle tone. Coordination normal. No atrophy Skin: Skin warm and dry Psychiatric: normal affect, pleasant  Recent Labs: No results found for requested labs within last 8760 hours.    Lipid Panel No results found for: CHOL, HDL, LDLCALC, TRIG    Wt Readings from Last 3 Encounters:  04/12/21 277 lb (125.6 kg)  10/08/20 269 lb (122 kg)  06/24/19 278 lb 6.4 oz (126.3 kg)     ASSESSMENT AND PLAN:  Problem List Items Addressed This Visit       Cardiology Problems   CAD (coronary artery disease) - Primary   Essential hypertension   Mixed hyperlipidemia   Other Visit Diagnoses     Myalgia due to statin         Cad with stable angian Currently with no symptoms of angina. No further workup at this time. Continue current medication regimen.  Morbid obesity We have encouraged continued exercise, careful diet management in an effort to lose weight.  Hyperlipidemia On zetia Total chol 157 \\does  not want statin, possible  myalgias Does not want PCSK9 inh We have discussed this with her again today  Stress Mom with dementia, lives with them  HTN Monitor at home If elevated  Shortness of breath Likely multifactorial, recommended weight loss, walking program for conditioning    Total encounter time more than 25 minutes  Greater than 50% was spent in counseling and coordination of care with the patient   Signed, 06/26/19, M.D., Ph.D. [facility] [place], [place]

## 2021-04-12 ENCOUNTER — Ambulatory Visit (INDEPENDENT_AMBULATORY_CARE_PROVIDER_SITE_OTHER): Payer: Self-pay | Admitting: Cardiovascular Disease

## 2021-04-12 ENCOUNTER — Encounter: Payer: Self-pay | Admitting: Cardiovascular Disease

## 2021-04-12 ENCOUNTER — Other Ambulatory Visit: Payer: Self-pay

## 2021-04-12 VITALS — BP 150/90 | HR 64 | Ht 65.0 in | Wt 277.0 lb

## 2021-04-12 DIAGNOSIS — T466X5D Adverse effect of antihyperlipidemic and antiarteriosclerotic drugs, subsequent encounter: Secondary | ICD-10-CM

## 2021-04-12 DIAGNOSIS — T466X5A Adverse effect of antihyperlipidemic and antiarteriosclerotic drugs, initial encounter: Secondary | ICD-10-CM

## 2021-04-12 DIAGNOSIS — I25118 Atherosclerotic heart disease of native coronary artery with other forms of angina pectoris: Secondary | ICD-10-CM

## 2021-04-12 DIAGNOSIS — M791 Myalgia, unspecified site: Secondary | ICD-10-CM

## 2021-04-12 DIAGNOSIS — E782 Mixed hyperlipidemia: Secondary | ICD-10-CM

## 2021-04-12 DIAGNOSIS — I10 Essential (primary) hypertension: Secondary | ICD-10-CM

## 2021-04-12 MED ORDER — VALSARTAN 80 MG PO TABS: 80.0000 mg | ORAL_TABLET | Freq: Every day | ORAL | 3 refills | Status: DC

## 2021-04-12 MED ORDER — ISOSORBIDE MONONITRATE ER 30 MG PO TB24: 30.0000 mg | ORAL_TABLET | Freq: Every day | ORAL | 3 refills | Status: DC

## 2021-04-12 MED ORDER — EZETIMIBE 10 MG PO TABS: 10.0000 mg | ORAL_TABLET | Freq: Every day | ORAL | 3 refills | Status: DC

## 2021-04-12 NOTE — Patient Instructions (Addendum)
Monitor blood pressure , call if elevated >140   Medication Instructions:  No changes  If you need a refill on your cardiac medications before your next appointment, please call your pharmacy.   Lab work: No new labs needed  Testing/Procedures: No new testing needed  Follow-Up: At [facility], you and your health needs are our priority.  As part of our continuing mission to provide you with exceptional heart care, we have created designated Provider Care Teams.  These Care Teams include your primary Cardiologist (physician) and Advanced Practice Providers (APPs -  Physician Assistants and Nurse Practitioners) who all work together to provide you with the care you need, when you need it.  You will need a follow up appointment in 12 months  Providers on your designated Care Team:   Nicolasa Ducking, NP Eula Listen, PA-C Marisue Ivan, PA-C Cadence [employer], [place]  COVID-19 Vaccine Information can be found at: [email or web address] For questions related to vaccine distribution or appointments, please email [email or web address] or call [phone].

## 2021-12-06 ENCOUNTER — Other Ambulatory Visit: Payer: Self-pay | Admitting: Physician Assistant

## 2021-12-06 DIAGNOSIS — Z1231 Encounter for screening mammogram for malignant neoplasm of breast: Secondary | ICD-10-CM

## 2022-01-02 ENCOUNTER — Other Ambulatory Visit: Payer: Self-pay | Admitting: Cardiovascular Disease

## 2022-03-20 ENCOUNTER — Telehealth: Payer: Self-pay | Admitting: *Deleted

## 2022-03-20 NOTE — Telephone Encounter (Signed)
Attempted to call patient to reschedule patients BCCCP appointment. No one answered phone. Left voicemail for patient to call me back.

## 2022-03-20 NOTE — Telephone Encounter (Signed)
Patient returned my phone call. Patient has a PCP and a current screening mammogram order by her provider. Approved patient for [employer] and sent message to [place] to schedule. Patient aware they will call her to schedule appointment. Patient verbalized understanding.

## 2022-03-29 ENCOUNTER — Ambulatory Visit: Payer: Self-pay

## 2022-03-29 ENCOUNTER — Ambulatory Visit
Admission: RE | Admit: 2022-03-29 | Discharge: 2022-03-29 | Disposition: A | Discharge status: Home / Self Care | Payer: Self-pay | Source: Ambulatory Visit | Attending: Physician Assistant | Admitting: Physician Assistant

## 2022-03-29 DIAGNOSIS — Z1231 Encounter for screening mammogram for malignant neoplasm of breast: Secondary | ICD-10-CM | POA: Insufficient documentation

## 2022-05-17 ENCOUNTER — Other Ambulatory Visit: Payer: Self-pay | Admitting: Cardiovascular Disease

## 2022-06-15 ENCOUNTER — Other Ambulatory Visit: Payer: Self-pay | Admitting: Cardiovascular Disease

## 2022-07-19 ENCOUNTER — Ambulatory Visit: Payer: Self-pay | Admitting: Cardiovascular Disease

## 2022-08-02 ENCOUNTER — Ambulatory Visit: Payer: Self-pay | Attending: Cardiovascular Disease | Admitting: Cardiovascular Disease

## 2022-08-02 ENCOUNTER — Encounter: Payer: Self-pay | Admitting: Cardiovascular Disease

## 2022-08-02 VITALS — BP 128/88 | HR 81 | Ht 64.0 in | Wt 272.1 lb

## 2022-08-02 DIAGNOSIS — R0609 Other forms of dyspnea: Secondary | ICD-10-CM

## 2022-08-02 DIAGNOSIS — I25118 Atherosclerotic heart disease of native coronary artery with other forms of angina pectoris: Secondary | ICD-10-CM

## 2022-08-02 DIAGNOSIS — I10 Essential (primary) hypertension: Secondary | ICD-10-CM

## 2022-08-02 DIAGNOSIS — E782 Mixed hyperlipidemia: Secondary | ICD-10-CM

## 2022-08-02 DIAGNOSIS — E1159 Type 2 diabetes mellitus with other circulatory complications: Secondary | ICD-10-CM

## 2022-08-02 DIAGNOSIS — Z789 Other specified health status: Secondary | ICD-10-CM

## 2022-08-02 MED ORDER — EZETIMIBE 10 MG PO TABS: 10.0000 mg | ORAL_TABLET | Freq: Every day | ORAL | 2 refills | Status: DC

## 2022-08-02 MED ORDER — NITROGLYCERIN 0.4 MG SL SUBL: 0.4000 mg | SUBLINGUAL_TABLET | SUBLINGUAL | 3 refills | Status: DC | PRN

## 2022-08-02 MED ORDER — ISOSORBIDE MONONITRATE ER 30 MG PO TB24: 30.0000 mg | ORAL_TABLET | Freq: Every day | ORAL | 2 refills | Status: DC

## 2022-08-02 MED ORDER — VALSARTAN 80 MG PO TABS: 80.0000 mg | ORAL_TABLET | Freq: Every day | ORAL | 2 refills | Status: DC

## 2022-08-02 MED ORDER — CARVEDILOL 25 MG PO TABS: 25.0000 mg | ORAL_TABLET | Freq: Two times a day (BID) | ORAL | 2 refills | Status: DC

## 2022-08-02 NOTE — Progress Notes (Signed)
Cardiology Office Note  Date:  08/02/2022   ID:  RIYA HUXFORD, DOB August 06, 1961, MRN [id number]  PCP:  Patrice Paradise, MD   Chief Complaint  Patient presents with   12 month follow up     Patient c/o chest discomfort and shortness of breath with over exertion. Medications reviewed by the patient verbally.     HPI:  Prisca Gearing is a 61 y.o. female with hx of  DM,  HTN,  hyperlipidemia,  obesity, and  CAD s/p inferior STEMI on 09/10/12 (PCI with BMS to [facility] and BMS to RPAV) and NSTEMI on 12/10/14 managed medically  who presents for follow-up of her CAD  Last seen by myself in clinic August 2022  Other past medical history as below 1. Borderline DM 2. HTN 3. Hyperlipidemia 4. Obesity 5. CAD s/p inferior STEMI on 08/31/12 A. Presented with sudden onset 10/10 sharp back pain between the clavicles and extending to the chest as 10/10 chest pressure, bilateral arm numbness, left jaw pain, nausea, vomiting. She presented to an OSH and was noted to have inferior ST elevations and was transferred to [facility]. B. LHC 08/31/12:  -RCA p60%, p100%, RPAV 99%; LCx OM1 70%; RI 40%, 70%; LAD m30%, d90%; LM normal;  -PCI to the mRCA with BMS (CORONARY INTEGRITY RX 4.00X15MM) -PCI to the RPAV with BMS (CORONARY INTEGRITY RX 2.25X12MM) C. 08/31/12: CK 107 to 123 to 95; Trop T 2.48 to 2.88 09/01/13: CK 74; Trop T 2.02 6. NSTEMI on 12/09/14 A. Presented with typical chest pain, non-diagnostic ECG, and elevated troponin I (0.09 to 0.80 to 1.72 [0.00-0.03 ng/ml]) B. LHC 12/10/14: LVEF 63%; Rt dominant; LAD m40%; d75%; D1 ostial 80%; LCx p100%; RI 75%; RCA ostial 60%; RPDA 40% C. She was treated medically  In follow-up today, no recent hospitalizations noted Previously declined statin, did not want PCSK9 inhibitor Tolerating Zetia Total cholesterol running in the 180 range  Mother with dementia, in bed, has caretakers  Denies significant chest pain concerning for angina Requesting refill on  nitro  Reports she is scheduled to meet with a nutritionist, she and her husband would like to lose weight No regular walking or exercise program  EKG personally reviewed by myself on todays visit NSR rate 81  Bpm, consider old anterior MI, consider old inferior MI   PMH:   has a past medical history of CAD (coronary artery disease) (09/09/2012), DOE (dyspnea on exertion) (10/07/2019), Essential hypertension (09/09/2012), Mixed hyperlipidemia (09/09/2012), Obesity, morbid (HCC) (04/08/2018), Statin intolerance (10/07/2019), and Type 2 diabetes mellitus (HCC) (04/09/2018).  PSH:    Past Surgical History:  Procedure Laterality Date   BREAST EXCISIONAL BIOPSY Right 2010   Benign    Current Outpatient Medications  Medication Sig Dispense Refill   aspirin 81 MG EC tablet Take by mouth.     carvedilol (COREG) 25 MG tablet TAKE ONE TABLET BY MOUTH TWICE A DAY 180 tablet 0   Coenzyme Q10 10 MG capsule Take by mouth.     cyanocobalamin 1000 MCG tablet Take by mouth.     ezetimibe (ZETIA) 10 MG tablet Take 1 tablet (10 mg total) by mouth daily. 90 tablet 3   isosorbide mononitrate (IMDUR) 30 MG 24 hr tablet TAKE ONE TABLET BY MOUTH DAILY 30 tablet 0   Multiple Vitamin (MULTI-VITAMIN) tablet Take 1 tablet by mouth daily.     Omega-3 Fatty Acids (OMEGA-3 2100) 1050 MG CAPS Take 1 tablet by mouth daily.     valsartan (DIOVAN) 80 MG  tablet Take 1 tablet (80 mg total) by mouth daily. 30 tablet 0   nitroGLYCERIN (NITROSTAT) 0.4 MG SL tablet Place 1 tablet (0.4 mg total) under the tongue every 5 (five) minutes as needed for chest pain. 25 tablet 3   No current facility-administered medications for this visit.     Allergies:   Lisinopril and Sulfa antibiotics   Social History:  The patient  reports that she has never smoked. She has never used smokeless tobacco. She reports current alcohol use of about 1.0 standard drink of alcohol per week. She reports that she does not use drugs.   Family History:    family history includes Breast cancer in her maternal aunt; Dementia in her mother; Diabetes in her mother; Heart failure in her father; Thyroid disease in her mother.    Review of Systems: Review of Systems  Constitutional: Negative.   HENT: Negative.    Respiratory: Negative.    Cardiovascular: Negative.   Gastrointestinal: Negative.   Musculoskeletal: Negative.   Neurological: Negative.   Psychiatric/Behavioral: Negative.    All other systems reviewed and are negative.   PHYSICAL EXAM: VS:  BP 128/88 (BP Location: Left Arm, Patient Position: Sitting, Cuff Size: Large)   Pulse 81   Ht 5\' 4"  (1.626 m)   Wt 272 lb 2 oz (123.4 kg)   SpO2 98%   BMI 46.71 kg/m  , BMI Body mass index is 46.71 kg/m. Constitutional:  oriented to person, place, and time. No distress.  HENT:  Head: Grossly normal Eyes:  no discharge. No scleral icterus.  Neck: No JVD, no carotid bruits  Cardiovascular: Regular rate and rhythm, no murmurs appreciated Pulmonary/Chest: Clear to auscultation bilaterally, no wheezes or rails Abdominal: Soft.  no distension.  no tenderness.  Musculoskeletal: Normal range of motion Neurological:  normal muscle tone. Coordination normal. No atrophy Skin: Skin warm and dry Psychiatric: normal affect, pleasant  Recent Labs: No results found for requested labs within last 365 days.    Lipid Panel No results found for: "CHOL", "HDL", "LDLCALC", "TRIG"    Wt Readings from Last 3 Encounters:  08/02/22 272 lb 2 oz (123.4 kg)  04/12/21 277 lb (125.6 kg)  10/08/20 269 lb (122 kg)     ASSESSMENT AND PLAN:  Problem List Items Addressed This Visit       Cardiology Problems   CAD (coronary artery disease) - Primary   Relevant Medications   nitroGLYCERIN (NITROSTAT) 0.4 MG SL tablet   Other Relevant Orders   EKG 12-Lead   Essential hypertension   Relevant Medications   nitroGLYCERIN (NITROSTAT) 0.4 MG SL tablet   Other Relevant Orders   EKG 12-Lead   Mixed  hyperlipidemia   Relevant Medications   nitroGLYCERIN (NITROSTAT) 0.4 MG SL tablet   Other Relevant Orders   EKG 12-Lead     Other   DOE (dyspnea on exertion)   Relevant Orders   EKG 12-Lead   Statin intolerance   Type 2 diabetes mellitus (HCC)  CAD with stable angian Currently with no symptoms of angina. No further workup at this time. Continue current medication regimen. Declining medications for cholesterol management Recommended walking program for weight loss, conditioning  Morbid obesity Reports she is scheduled to meet with nutritionist We have encouraged continued exercise, careful diet management in an effort to lose weight.  Hyperlipidemia On zetia Total chol 180s \\does  not want statin, possible myalgias Does not want PCSK9 inh This was discussed with her again today  Stress Mother with  dementia, has caretakers  HTN Blood pressure is well controlled on today's visit. No changes made to the medications.  Shortness of breath Likely multifactorial,  Recommend regular walking program, weight loss     Total encounter time more than 30 minutes  Greater than 50% was spent in counseling and coordination of care with the patient   Signed, Dossie Arbour, M.D., Ph.D. [facility] [place], [place] [phone]

## 2022-08-02 NOTE — Patient Instructions (Signed)
Medication Instructions:  No changes  If you need a refill on your cardiac medications before your next appointment, please call your pharmacy.   Lab work: No new labs needed  Testing/Procedures: No new testing needed  Follow-Up: At [facility], you and your health needs are our priority.  As part of our continuing mission to provide you with exceptional heart care, we have created designated Provider Care Teams.  These Care Teams include your primary Cardiologist (physician) and Advanced Practice Providers (APPs -  Physician Assistants and Nurse Practitioners) who all work together to provide you with the care you need, when you need it.  You will need a follow up appointment in 12 months  Providers on your designated Care Team:   Christopher Berge, NP Ryan Dunn, PA-C Cadence Furth, PA-C  COVID-19 Vaccine Information can be found at: [email or web address] For questions related to vaccine distribution or appointments, please email [email or web address] or call [phone].   

## 2022-08-22 ENCOUNTER — Other Ambulatory Visit: Payer: Self-pay

## 2022-08-22 MED ORDER — VALSARTAN 80 MG PO TABS: 80.0000 mg | ORAL_TABLET | Freq: Every day | ORAL | 3 refills | Status: DC

## 2022-10-09 ENCOUNTER — Telehealth: Payer: Self-pay | Admitting: Cardiovascular Disease

## 2022-10-09 MED ORDER — ISOSORBIDE MONONITRATE ER 30 MG PO TB24: 30.0000 mg | ORAL_TABLET | Freq: Every day | ORAL | 3 refills | Status: DC

## 2022-10-09 NOTE — Telephone Encounter (Signed)
*  STAT* If patient is at the pharmacy, call can be transferred to refill team.   1. Which medications need to be refilled? (please list name of each medication and dose if known)  isosorbide mononitrate (IMDUR) 30 MG 24 hr tablet   2. Which pharmacy/location (including street and city if local pharmacy) is medication to be sent to? [id number] - [address], [place]   3. Do they need a 30 day or 90 day supply? 90 day

## 2022-10-09 NOTE — Telephone Encounter (Signed)
Requested Prescriptions   Signed Prescriptions Disp Refills  . isosorbide mononitrate (IMDUR) 30 MG 24 hr tablet 90 tablet 3    Sig: Take 1 tablet (30 mg total) by mouth daily.    Authorizing Provider: GOLLAN, TIMOTHY J    Ordering User: Vonzella Althaus C    

## 2022-10-16 ENCOUNTER — Other Ambulatory Visit: Payer: Self-pay

## 2022-10-16 ENCOUNTER — Emergency Department: Payer: BLUE CROSS/BLUE SHIELD

## 2022-10-16 ENCOUNTER — Emergency Department
Admission: EM | Admit: 2022-10-16 | Discharge: 2022-10-17 | Disposition: A | Discharge status: Home / Self Care | Payer: BLUE CROSS/BLUE SHIELD | Attending: Emergency Medicine | Admitting: Emergency Medicine

## 2022-10-16 DIAGNOSIS — I2510 Atherosclerotic heart disease of native coronary artery without angina pectoris: Secondary | ICD-10-CM | POA: Diagnosis not present

## 2022-10-16 DIAGNOSIS — S01511A Laceration without foreign body of lip, initial encounter: Secondary | ICD-10-CM | POA: Insufficient documentation

## 2022-10-16 DIAGNOSIS — E119 Type 2 diabetes mellitus without complications: Secondary | ICD-10-CM | POA: Diagnosis not present

## 2022-10-16 DIAGNOSIS — W01198A Fall on same level from slipping, tripping and stumbling with subsequent striking against other object, initial encounter: Secondary | ICD-10-CM | POA: Diagnosis not present

## 2022-10-16 DIAGNOSIS — M25562 Pain in left knee: Secondary | ICD-10-CM | POA: Diagnosis present

## 2022-10-16 DIAGNOSIS — Z23 Encounter for immunization: Secondary | ICD-10-CM | POA: Diagnosis not present

## 2022-10-16 DIAGNOSIS — S0181XA Laceration without foreign body of other part of head, initial encounter: Secondary | ICD-10-CM | POA: Diagnosis not present

## 2022-10-16 DIAGNOSIS — I10 Essential (primary) hypertension: Secondary | ICD-10-CM | POA: Diagnosis not present

## 2022-10-16 DIAGNOSIS — S82142A Displaced bicondylar fracture of left tibia, initial encounter for closed fracture: Secondary | ICD-10-CM

## 2022-10-16 DIAGNOSIS — S82135A Nondisplaced fracture of medial condyle of left tibia, initial encounter for closed fracture: Secondary | ICD-10-CM | POA: Insufficient documentation

## 2022-10-16 NOTE — ED Triage Notes (Signed)
Pt presents to ER with c/o fall that happened around 1530 today while walking dogs.  Pt states she was starting to run after her dogs, when she tripped in the gravel, and fell.  Pt states she hit her left knee first and then her face.  Pt has abrasions noted to face.  Small puncture wound noted to forehead and around left eye.  Pt denies LOC, but denies blood thinners.  Pt states she is able to bend knee but cannot bear weight on it.  Pt is otherwise A&O x4 and in NAD.

## 2022-10-17 ENCOUNTER — Emergency Department: Payer: BLUE CROSS/BLUE SHIELD

## 2022-10-17 MED ORDER — TETANUS-DIPHTH-ACELL PERTUSSIS 5-2.5-18.5 LF-MCG/0.5 IM SUSY
0.5000 mL | PREFILLED_SYRINGE | Freq: Once | INTRAMUSCULAR | Status: AC
  Administered 2022-10-17: 0.5 mL via INTRAMUSCULAR
  Filled 2022-10-17: qty 0.5

## 2022-10-17 MED ORDER — ACETAMINOPHEN 500 MG PO TABS
1000.0000 mg | ORAL_TABLET | Freq: Once | ORAL | Status: DC
  Filled 2022-10-17: qty 2

## 2022-10-17 MED ORDER — LIDOCAINE-EPINEPHRINE 2 %-1:100000 IJ SOLN
20.0000 mL | Freq: Once | INTRAMUSCULAR | Status: AC
  Administered 2022-10-17: 20 mL
  Filled 2022-10-17: qty 1

## 2022-10-17 MED ORDER — KETOROLAC TROMETHAMINE 15 MG/ML IJ SOLN
15.0000 mg | Freq: Once | INTRAMUSCULAR | Status: AC
  Administered 2022-10-17: 15 mg via INTRAMUSCULAR
  Filled 2022-10-17: qty 1

## 2022-10-17 MED ORDER — OXYCODONE-ACETAMINOPHEN 5-325 MG PO TABS
1.0000 | ORAL_TABLET | Freq: Once | ORAL | Status: DC
  Filled 2022-10-17: qty 1

## 2022-10-17 NOTE — Discharge Instructions (Signed)
Please keep the knee immobilizer in place when you are walking.  You can put your toes down and touch the floor but do not fully weight-bear on that side.  It is important that you elevate the leg when you are sitting down to help reduce swelling.  If you start having any numbness or tingling in your foot or leg or your pain is increasing please return to the emergency department or follow-up with your orthopedist as this can be a sign of compartment syndrome.  Please follow-up with the orthopedic doctors in the next week.  You can take Tylenol Motrin for pain.

## 2022-10-17 NOTE — ED Notes (Signed)
Upon initial engagement, pt has purple and blue bruising to L periorbital region.  Pt able to open both eyes, and responds appropriately when speaking to RN

## 2022-10-17 NOTE — ED Provider Notes (Signed)
[facility] Provider Note    Event Date/Time   First MD Initiated Contact with Patient 10/16/22 2349     (approximate)   History   Fall   HPI  Denise [name] is a 62 y.o. female requested coronary artery disease status post tenting, hypertension obesity hyperlipidemia type 2 diabetes who presents after a fall.  Patient was walking her dogs when they were going to run in the street she tried to step on the leash to stop them but she then fell.  Hit her head and fell onto the left knee.  She was able to ambulate afterward but has had significant left knee pain since.  Denies loss of consciousness.  She denies chest abdominal or hip or back or neck pain.  Unsure of last tetanus.     Past Medical History:  Diagnosis Date   CAD (coronary artery disease) 09/09/2012   Formatting of this note might be different from the original. A. 08/2012 STEMI. Cath: nl left main, L Cx with prox 70% stenosis, diffuse 60-80% stenosis in mid segment, LAD with 90% in the apical LAD, 60% ostial RCA, and 100% thrombotic occlusion to the distal RCA that was suspected to be culprit lesion. Cath team placed 2 x BMS to distal RCA lesion. B. 08/2012 echo: preserved LV function with infer   DOE (dyspnea on exertion) 10/07/2019   Essential hypertension 09/09/2012   Mixed hyperlipidemia 09/09/2012   Formatting of this note might be different from the original. 03/2019: TC 171 LDL 94 HDL 55 TG 109   Obesity, morbid ([place]) 04/08/2018   Statin intolerance 10/07/2019   Type 2 diabetes mellitus ([place]) 04/09/2018    Patient Active Problem List   Diagnosis Date Noted   DOE (dyspnea on exertion) 10/07/2019   Statin intolerance 10/07/2019   Type 2 diabetes mellitus ([place]) 04/09/2018   Obesity, morbid ([place]) 04/08/2018   CAD (coronary artery disease) 09/09/2012   Essential hypertension 09/09/2012   Mixed hyperlipidemia 09/09/2012     Physical Exam  Triage Vital Signs: ED Triage Vitals [10/16/22 2140]   Enc Vitals Group     BP 120/72     Pulse Rate 76     Resp 19     Temp 97.7 F (36.5 C)     Temp Source Oral     SpO2 96 %     Weight 270 lb (122.5 kg)     Height 5' 4"$  (1.626 m)     Head Circumference      Peak Flow      Pain Score 8     Pain Loc      Pain Edu?      Excl. in [place]?     Most recent vital signs: Vitals:   10/17/22 0215 10/17/22 0230  BP:    Pulse: 66 70  Resp:    Temp:    SpO2: 99% 99%     General: Awake, no distress.  CV:  Good peripheral perfusion.  Pitting edema in the bilateral lower extremities Resp:  Normal effort.  Abd:  No distention.  Neuro:             Awake, Alert, Oriented x 3  Other:  Patient has a small superficial laceration that is well-approximated on the left forehead, hemostatic, there is approximately 1 cm vertical laceration just lateral to the lateral canthus of the left eye Approximately 0.5 cm linear vertical laceration above the lip that does not involve the vermilion border Mild  left periorbital ecchymosis, no significant swelling No midface tenderness or deformity extraocular movements intact no septal hematoma No C-spine tenderness no chest wall tenderness Abdomen soft nontender Pelvis stable able to range hips bilaterally There is swelling noted to the left knee no significant tenderness to palpation, patient is able to straight leg raise, is able to flex but with significant pain Lower leg compartment is soft  2+ radial pulses  Sensation and motor intact LLE    ED Results / Procedures / Treatments  Labs (all labs ordered are listed, but only abnormal results are displayed) Labs Reviewed - No data to display   EKG     RADIOLOGY I reviewed and interpreted the CT scan of the brain which does not show any acute intracranial process    PROCEDURES:  Critical Care performed: No  ..Laceration Repair  Date/Time: 10/17/2022 2:19 AM  Performed by: Rada Hay, MD Authorized by: Rada Hay, MD    Consent:    Consent obtained:  Verbal   Risks discussed:  Infection, pain and poor cosmetic result Universal protocol:    Patient identity confirmed:  Verbally with patient Anesthesia:    Anesthesia method:  Local infiltration   Local anesthetic:  Lidocaine 2% WITH epi Laceration details:    Location:  Face   Facial location: lateral to L eye.   Length (cm):  1 Pre-procedure details:    Preparation:  Patient was prepped and draped in usual sterile fashion Exploration:    Limited defect created (wound extended): no     Wound extent: no foreign body, no signs of injury, no underlying fracture and no vascular damage     Contaminated: no   Treatment:    Area cleansed with:  Saline   Amount of cleaning:  Standard   Irrigation solution:  Sterile water and sterile saline   Irrigation method:  Syringe   Visualized foreign bodies/material removed: no     Debridement:  None   Undermining:  None Skin repair:    Repair method:  Sutures   Suture size:  5-0   Wound skin closure material used: vicryl rapide.   Suture technique:  Simple interrupted   Number of sutures:  3 Approximation:    Approximation:  Close Repair type:    Repair type:  Simple Post-procedure details:    Dressing:  Open (no dressing)   Procedure completion:  Tolerated .Marland KitchenLaceration Repair  Date/Time: 10/17/2022 2:20 AM  Performed by: Rada Hay, MD Authorized by: Rada Hay, MD   Consent:    Consent obtained:  Verbal   Risks discussed:  Pain and poor cosmetic result Universal protocol:    Patient identity confirmed:  Verbally with patient Anesthesia:    Anesthesia method:  None Laceration details:    Location:  Face   Face location:  Forehead   Length (cm):  1 Pre-procedure details:    Preparation:  Imaging obtained to evaluate for foreign bodies Exploration:    Limited defect created (wound extended): no     Imaging outcome: foreign body not noted     Contaminated: no   Treatment:     Area cleansed with:  Saline   Amount of cleaning:  Standard   Irrigation solution:  Sterile saline   Irrigation method:  Syringe   Visualized foreign bodies/material removed: no     Debridement:  None Skin repair:    Repair method:  Tissue adhesive Approximation:    Approximation:  Close Repair type:    Repair  type:  Simple Post-procedure details:    Dressing:  Open (no dressing)   Procedure completion:  Tolerated .Marland KitchenLaceration Repair  Date/Time: 10/17/2022 2:21 AM  Performed by: Rada Hay, MD Authorized by: Rada Hay, MD   Consent:    Consent obtained:  Verbal   Risks discussed:  Infection, pain and poor cosmetic result   Alternatives discussed:  No treatment Universal protocol:    Patient identity confirmed:  Verbally with patient Anesthesia:    Anesthesia method:  None Laceration details:    Location:  Face   Facial location: skin lip, midline.   Length (cm):  1 Exploration:    Limited defect created (wound extended): no     Imaging outcome: foreign body not noted   Treatment:    Area cleansed with:  Saline   Amount of cleaning:  Standard   Irrigation solution:  Sterile saline   Irrigation method:  Syringe   Visualized foreign bodies/material removed: no     Debridement:  None Skin repair:    Repair method:  Tissue adhesive Approximation:    Approximation:  Close Repair type:    Repair type:  Simple Post-procedure details:    Dressing:  Open (no dressing)   Procedure completion:  Tolerated   The patient is on the cardiac monitor to evaluate for evidence of arrhythmia and/or significant heart rate changes.   MEDICATIONS ORDERED IN ED: Medications  acetaminophen (TYLENOL) tablet 1,000 mg (1,000 mg Oral Patient Refused/Not Given 10/17/22 0114)  lidocaine-EPINEPHrine (XYLOCAINE W/EPI) 2 %-1:100000 (with pres) injection 20 mL (20 mLs Infiltration Given by Other 10/17/22 0036)  Tdap (BOOSTRIX) injection 0.5 mL (0.5 mLs Intramuscular Given  10/17/22 0116)  ketorolac (TORADOL) 15 MG/ML injection 15 mg (15 mg Intramuscular Given 10/17/22 0123)     IMPRESSION / MDM / ASSESSMENT AND PLAN / ED COURSE  I reviewed the triage vital signs and the nursing notes.                              Patient's presentation is most consistent with acute complicated illness / injury requiring diagnostic workup.  Differential diagnosis includes, but is not limited to, facial bone fracture, intracranial hemorrhage, patella fracture, tibial plateau fracture, ligamentous injury of the knee  Patient is a 62 year old female presents after mechanical fall that occurred when she was walking the dogs.  Patient tried to stop the dog from running away by stepping on the leash and then fell forward hitting her face but did not lose consciousness also landed on the left knee.  Her main complaint is left knee pain.  Initially was able to weight-bear but after icing and taking Tylenol she has not been able to bear weight.  She does have 3 lacerations on her face.  Small superficial laceration that will not require sutures on the forehead vertical well-approximated 0.5 cm laceration just above the lip does not involve the vermilion border that we will repair with skin glue and a approximately 1 cm laceration just lateral to the lateral canthus of the left eye that was repaired with sutures.  No other signs of facial fracture on exam.  Her only other sign of trauma is pain and decreased range of motion of the left knee.  There is some swelling and effusion.  Patient is neurovascular intact.  Imaging ordered from triage include CT head C-spine max face left knee x-ray left tib-fib x-ray are all reassuring.  There is no  obvious fracture on knee film.  Given patient is unable to weight-bear with direct blow to the knee I am concerned for possible occult tibial plateau fracture we will proceed with CT of the knee.  CT shows a nondisplaced tibial plateau fracture. I discussed  with Dr. Sharlet Salina with orthopedics who notes that this is nonoperative.  Patient can be placed in a knee immobilizer with toe-touch only otherwise nonweightbearing.  Will need to follow-up with Ortho in about 10 to 14 days.  Patient had knee immobilizer applied in the ED.  Did well with nonweightbearing and using crutches.  She would like to be discharged I think this is reasonable.  Discussed signs of compartment syndrome to look out for and elevation of the leg.  Patient provided with orthopedic contact information for follow-up.   Clinical Course as of 10/17/22 0325  Tue Oct 17, 2022  0040 CT Maxillofacial Wo Contrast [KM]    Clinical Course User Index [KM] Rada Hay, MD     FINAL CLINICAL IMPRESSION(S) / ED DIAGNOSES   Final diagnoses:  Facial laceration, initial encounter  Closed fracture of left tibial plateau, initial encounter     Rx / DC Orders   ED Discharge Orders     None        Note:  This document was prepared using Dragon voice recognition software and may include unintentional dictation errors.   Rada Hay, MD 10/17/22 [id number]

## 2022-10-17 NOTE — ED Notes (Signed)
Patient transported to CT 

## 2023-04-08 ENCOUNTER — Encounter
Admission: EM | Disposition: A | Discharge status: Home / Self Care | Payer: Self-pay | Source: Home / Self Care | Attending: Internal Medicine

## 2023-04-08 ENCOUNTER — Emergency Department: Payer: Self-pay

## 2023-04-08 ENCOUNTER — Inpatient Hospital Stay
Admission: EM | Admit: 2023-04-08 | Discharge: 2023-04-10 | DRG: 321 | Disposition: A | Discharge status: Home / Self Care | Payer: Self-pay | Attending: Internal Medicine | Admitting: Internal Medicine

## 2023-04-08 ENCOUNTER — Other Ambulatory Visit: Payer: Self-pay

## 2023-04-08 DIAGNOSIS — Z79899 Other long term (current) drug therapy: Secondary | ICD-10-CM

## 2023-04-08 DIAGNOSIS — Z6841 Body Mass Index (BMI) 40.0 and over, adult: Secondary | ICD-10-CM

## 2023-04-08 DIAGNOSIS — I4729 Other ventricular tachycardia: Secondary | ICD-10-CM

## 2023-04-08 DIAGNOSIS — Z8249 Family history of ischemic heart disease and other diseases of the circulatory system: Secondary | ICD-10-CM

## 2023-04-08 DIAGNOSIS — I10 Essential (primary) hypertension: Secondary | ICD-10-CM | POA: Diagnosis present

## 2023-04-08 DIAGNOSIS — E782 Mixed hyperlipidemia: Secondary | ICD-10-CM | POA: Diagnosis present

## 2023-04-08 DIAGNOSIS — I2119 ST elevation (STEMI) myocardial infarction involving other coronary artery of inferior wall: Secondary | ICD-10-CM | POA: Diagnosis present

## 2023-04-08 DIAGNOSIS — Z882 Allergy status to sulfonamides status: Secondary | ICD-10-CM

## 2023-04-08 DIAGNOSIS — Z794 Long term (current) use of insulin: Secondary | ICD-10-CM

## 2023-04-08 DIAGNOSIS — Z7982 Long term (current) use of aspirin: Secondary | ICD-10-CM

## 2023-04-08 DIAGNOSIS — Z833 Family history of diabetes mellitus: Secondary | ICD-10-CM

## 2023-04-08 DIAGNOSIS — I2510 Atherosclerotic heart disease of native coronary artery without angina pectoris: Secondary | ICD-10-CM | POA: Diagnosis present

## 2023-04-08 DIAGNOSIS — E785 Hyperlipidemia, unspecified: Secondary | ICD-10-CM | POA: Diagnosis present

## 2023-04-08 DIAGNOSIS — I440 Atrioventricular block, first degree: Secondary | ICD-10-CM | POA: Diagnosis not present

## 2023-04-08 DIAGNOSIS — I213 ST elevation (STEMI) myocardial infarction of unspecified site: Secondary | ICD-10-CM

## 2023-04-08 DIAGNOSIS — Y831 Surgical operation with implant of artificial internal device as the cause of abnormal reaction of the patient, or of later complication, without mention of misadventure at the time of the procedure: Secondary | ICD-10-CM | POA: Diagnosis present

## 2023-04-08 DIAGNOSIS — Z888 Allergy status to other drugs, medicaments and biological substances status: Secondary | ICD-10-CM

## 2023-04-08 DIAGNOSIS — E119 Type 2 diabetes mellitus without complications: Secondary | ICD-10-CM

## 2023-04-08 DIAGNOSIS — R001 Bradycardia, unspecified: Secondary | ICD-10-CM | POA: Diagnosis not present

## 2023-04-08 DIAGNOSIS — I2582 Chronic total occlusion of coronary artery: Secondary | ICD-10-CM | POA: Diagnosis present

## 2023-04-08 DIAGNOSIS — I472 Ventricular tachycardia, unspecified: Secondary | ICD-10-CM | POA: Diagnosis not present

## 2023-04-08 DIAGNOSIS — I2111 ST elevation (STEMI) myocardial infarction involving right coronary artery: Secondary | ICD-10-CM | POA: Diagnosis present

## 2023-04-08 DIAGNOSIS — T82855A Stenosis of coronary artery stent, initial encounter: Principal | ICD-10-CM | POA: Diagnosis present

## 2023-04-08 HISTORY — PX: LEFT HEART CATH AND CORONARY ANGIOGRAPHY: CATH118249

## 2023-04-08 HISTORY — PX: CORONARY/GRAFT ACUTE MI REVASCULARIZATION: CATH118305

## 2023-04-08 LAB — BASIC METABOLIC PANEL
Anion gap: 9 (ref 5–15)
BUN: 25 mg/dL — ABNORMAL HIGH (ref 8–23)
CO2: 26 mmol/L (ref 22–32)
Calcium: 9.7 mg/dL (ref 8.9–10.3)
Chloride: 101 mmol/L (ref 98–111)
Creatinine, Ser: 0.81 mg/dL (ref 0.44–1.00)
GFR, Estimated: >60 mL/min (ref >60)
Glucose, Bld: 196 mg/dL — ABNORMAL HIGH (ref 70–99)
Potassium: 3.9 mmol/L (ref 3.5–5.1)
Sodium: 136 mmol/L (ref 135–145)

## 2023-04-08 LAB — CBC
HCT: 44.8 % (ref 36.0–46.0)
Hemoglobin: 14.7 g/dL (ref 12.0–15.0)
MCH: 28.8 pg (ref 26.0–34.0)
MCHC: 32.8 g/dL (ref 30.0–36.0)
MCV: 87.7 fL (ref 80.0–100.0)
Platelets: 197 10*3/uL (ref 150–400)
RBC: 5.11 MIL/uL (ref 3.87–5.11)
RDW: 12.7 % (ref 11.5–15.5)
WBC: 9.8 10*3/uL (ref 4.0–10.5)
nRBC: 0 % (ref 0.0–0.2)

## 2023-04-08 LAB — TROPONIN I (HIGH SENSITIVITY): Troponin I (High Sensitivity): 392 ng/L (ref <18)

## 2023-04-08 SURGERY — CORONARY/GRAFT ACUTE MI REVASCULARIZATION
Anesthesia: Moderate Sedation

## 2023-04-08 MED ORDER — FENTANYL CITRATE PF 50 MCG/ML IJ SOSY
50.0000 ug | PREFILLED_SYRINGE | Freq: Once | INTRAMUSCULAR | Status: AC
  Administered 2023-04-08: 50 ug via INTRAVENOUS

## 2023-04-08 MED ORDER — HEPARIN (PORCINE) 25000 UT/250ML-% IV SOLN
1100.0000 [IU]/h | INTRAVENOUS | Status: DC
  Administered 2023-04-08: 1100 [IU]/h via INTRAVENOUS
  Filled 2023-04-08: qty 250

## 2023-04-08 MED ORDER — HEPARIN (PORCINE) IN NACL 1000-0.9 UT/500ML-% IV SOLN
INTRAVENOUS | Status: AC
  Filled 2023-04-08: qty 1000

## 2023-04-08 MED ORDER — MIDAZOLAM HCL 2 MG/2ML IJ SOLN
INTRAMUSCULAR | Status: AC
  Filled 2023-04-08: qty 2

## 2023-04-08 MED ORDER — SODIUM CHLORIDE 0.9 % IV SOLN: INTRAVENOUS | Status: DC

## 2023-04-08 MED ORDER — FENTANYL CITRATE (PF) 100 MCG/2ML IJ SOLN
INTRAMUSCULAR | Status: AC
  Filled 2023-04-08: qty 2

## 2023-04-08 MED ORDER — FENTANYL CITRATE PF 50 MCG/ML IJ SOSY
PREFILLED_SYRINGE | INTRAMUSCULAR | Status: AC
  Filled 2023-04-08: qty 1

## 2023-04-08 MED ORDER — PRASUGREL HCL 10 MG PO TABS
ORAL_TABLET | ORAL | Status: DC | PRN
  Administered 2023-04-08: 60 mg via ORAL

## 2023-04-08 MED ORDER — VERAPAMIL HCL 2.5 MG/ML IV SOLN
INTRAVENOUS | Status: DC | PRN
  Administered 2023-04-08 (×2): 2.5 mg via INTRA_ARTERIAL

## 2023-04-08 MED ORDER — NITROGLYCERIN 0.4 MG SL SUBL
0.4000 mg | SUBLINGUAL_TABLET | SUBLINGUAL | Status: DC | PRN
  Administered 2023-04-08: 0.4 mg via SUBLINGUAL

## 2023-04-08 MED ORDER — HEPARIN SODIUM (PORCINE) 5000 UNIT/ML IJ SOLN
4000.0000 [IU] | Freq: Once | INTRAMUSCULAR | Status: AC
  Administered 2023-04-08: 4000 [IU] via INTRAVENOUS

## 2023-04-08 MED ORDER — MIDAZOLAM HCL 2 MG/2ML IJ SOLN
INTRAMUSCULAR | Status: DC | PRN
  Administered 2023-04-08: 1 mg via INTRAVENOUS

## 2023-04-08 MED ORDER — HEPARIN SODIUM (PORCINE) 1000 UNIT/ML IJ SOLN
INTRAMUSCULAR | Status: AC
  Filled 2023-04-08: qty 10

## 2023-04-08 MED ORDER — ASPIRIN 81 MG PO CHEW
324.0000 mg | CHEWABLE_TABLET | Freq: Once | ORAL | Status: AC
  Administered 2023-04-08: 324 mg via ORAL

## 2023-04-08 MED ORDER — PRASUGREL HCL 10 MG PO TABS
ORAL_TABLET | ORAL | Status: AC
  Filled 2023-04-08: qty 6

## 2023-04-08 MED ORDER — LIDOCAINE HCL 1 % IJ SOLN
INTRAMUSCULAR | Status: AC
  Filled 2023-04-08: qty 20

## 2023-04-08 MED ORDER — ATROPINE SULFATE 1 MG/10ML IJ SOSY
PREFILLED_SYRINGE | INTRAMUSCULAR | Status: DC | PRN
  Administered 2023-04-08: .5 mg via INTRAVENOUS

## 2023-04-08 MED ORDER — HEPARIN SODIUM (PORCINE) 1000 UNIT/ML IJ SOLN
INTRAMUSCULAR | Status: DC | PRN
  Administered 2023-04-08: 7000 [IU] via INTRAVENOUS
  Administered 2023-04-08: 3000 [IU] via INTRAVENOUS
  Administered 2023-04-09: 2000 [IU] via INTRAVENOUS

## 2023-04-08 MED ORDER — HEPARIN (PORCINE) IN NACL 2000-0.9 UNIT/L-% IV SOLN
INTRAVENOUS | Status: DC | PRN
  Administered 2023-04-08: 1000 mL

## 2023-04-08 MED ORDER — FENTANYL CITRATE (PF) 100 MCG/2ML IJ SOLN
INTRAMUSCULAR | Status: DC | PRN
  Administered 2023-04-08: 25 ug via INTRAVENOUS

## 2023-04-08 MED ORDER — VERAPAMIL HCL 2.5 MG/ML IV SOLN
INTRAVENOUS | Status: AC
  Filled 2023-04-08: qty 2

## 2023-04-08 SURGICAL SUPPLY — 23 items
BALLN TREK RX 2.5X12 (BALLOONS) ×1
BALLN ~~LOC~~ EUPHORA RX 4.0X15 (BALLOONS) ×1
BALLN ~~LOC~~ TREK NEO RX 4.0X12 (BALLOONS) IMPLANT
BALLOON TREK RX 2.5X12 (BALLOONS) IMPLANT
BALLOON ~~LOC~~ EUPHORA RX 4.0X15 (BALLOONS) IMPLANT
CATH INFINITI 5 FR JL3.5 (CATHETERS) IMPLANT
CATH INFINITI JR4 5F (CATHETERS) IMPLANT
CATH VISTA GUIDE 6FR JR4 (CATHETERS) IMPLANT
DEVICE RAD TR BAND REGULAR (VASCULAR PRODUCTS) IMPLANT
DRAPE BRACHIAL (DRAPES) IMPLANT
GLIDESHEATH SLEND SS 6F .021 (SHEATH) IMPLANT
GUIDEWIRE INQWIRE 1.5J.035X260 (WIRE) IMPLANT
INQWIRE 1.5J .035X260CM (WIRE) ×1
KIT ENCORE 26 ADVANTAGE (KITS) IMPLANT
PACK CARDIAC CATH (CUSTOM PROCEDURE TRAY) ×2 IMPLANT
PANNUS RETENTION SYSTEM 2 PAD (MISCELLANEOUS) IMPLANT
PROTECTION STATION PRESSURIZED (MISCELLANEOUS) ×1
SET ATX-X65L (MISCELLANEOUS) IMPLANT
STATION PROTECTION PRESSURIZED (MISCELLANEOUS) IMPLANT
STENT ONYX FRONTIER 3.5X34 (Permanent Stent) IMPLANT
STENT ONYX FRONTIER 4.0X15 (Permanent Stent) IMPLANT
WIRE G HI TQ BMW 190 (WIRE) IMPLANT
WIRE RUNTHROUGH .014X180CM (WIRE) IMPLANT

## 2023-04-08 NOTE — ED Triage Notes (Signed)
Pt to ED via POV c/o central chest pain that started around 2:30pm when she was weedeating. Pain radiates to upper jaw. Pain getting progressively worse. Took 1 nitroglycerin at 6:30. Another at 6:50, another at 8:30 and 4th dose PTA. Pt also ensdorses some SOB with CP. Hx of MI in 2014

## 2023-04-08 NOTE — Progress Notes (Incomplete)
ANTICOAGULATION CONSULT NOTE  Pharmacy Consult for heparin infusion Indication: ACS/STEMI  Allergies  Allergen Reactions   Lisinopril Swelling   Sulfa Antibiotics Itching    Patient Measurements: Height: 5\' 4"  (162.6 cm) Weight: 117.9 kg (260 lb) IBW/kg (Calculated) : 54.7 Heparin Dosing Weight: 83.2 kg  Vital Signs: Temp: 97.8 F (36.6 C) (08/11 2212) Temp Source: Oral (08/11 2212) BP: 171/106 (08/11 2212) Pulse Rate: 76 (08/11 2212)  Labs: Recent Labs    04/08/23 2208  HGB 14.7  HCT 44.8  PLT 197    CrCl cannot be calculated (Patient's most recent lab result is older than the maximum 21 days allowed.).   Medical History: Past Medical History:  Diagnosis Date   CAD (coronary artery disease) 09/09/2012   Formatting of this note might be different from the original. A. 08/2012 STEMI. Cath: nl left main, L Cx with prox 70% stenosis, diffuse 60-80% stenosis in mid segment, LAD with 90% in the apical LAD, 60% ostial RCA, and 100% thrombotic occlusion to the distal RCA that was suspected to be culprit lesion. Cath team placed 2 x BMS to distal RCA lesion. B. 08/2012 echo: preserved LV function with infer   DOE (dyspnea on exertion) 10/07/2019   Essential hypertension 09/09/2012   Mixed hyperlipidemia 09/09/2012   Formatting of this note might be different from the original. 03/2019: TC 171 LDL 94 HDL 55 TG 109   Obesity, morbid ([facility]) 04/08/2018   Statin intolerance 10/07/2019   Type 2 diabetes mellitus ([facility]) 04/09/2018    Assessment: Pt is a 62 yo female with h/o MI presenting to ED c/o worsening central chest pain, radiating to upper jaw and SOB, now w/ code STEMI protocol activated.  Goal of Therapy:  Heparin level 0.3-0.7 units/ml Monitor platelets by anticoagulation protocol: Yes   Plan:  Bolus 4000 units x 1 given prior to baseline anticoag labs Start heparin infusion at 1100 units/hr Will check anticoag labs in 6 hr after start of infusion CBC daily while on  heparin  Otelia Sergeant, PharmD, [facility] 04/08/2023 10:39 PM

## 2023-04-08 NOTE — Consult Note (Signed)
Cardiology Consultation   Patient ID: Denise [name] MRN: [id number]; DOB: [birth year]  Admit date: 04/08/2023 Date of Consult: 04/08/2023  PCP:  Denise Paradise, MD   [facility] Providers Cardiologist:  Denise [name]     Patient Profile:   Denise [name] is a 62 y.o. female with a hx of coronary artery disease with remote bare-metal stents to the RCA and known chronic total occlusion of the LCx, hypertension, hyperlipidemia, and borderline diabetes mellitus, who is being seen 04/08/2023 for the evaluation of chest pain and abnormal EKG at the request of Dr. Hassie Bruce.  History of Present Illness:   Ms. Denise [name] was in her usual state of health until approximately 2:30 PM when she developed pain in her left jaw that subsequently radiated down into the chest after having worked in the yard.  Pain continued progressed throughout the afternoon and was accompanied by shortness of breath and lightheadedness.  She therefore presented to the ED this evening was noted to have inferior ST segment elevation.  She has received aspirin, nitroglycerin, heparin, and fentanyl with minimal improvement in her pain, which is currently 9/10 in intensity.  She reports being compliant with her medications at home.   Past Medical History:  Diagnosis Date   CAD (coronary artery disease) 09/09/2012   Formatting of this note might be different from the original. A. 08/2012 STEMI. Cath: nl left main, L Cx with prox 70% stenosis, diffuse 60-80% stenosis in mid segment, LAD with 90% in the apical LAD, 60% ostial RCA, and 100% thrombotic occlusion to the distal RCA that was suspected to be culprit lesion. Cath team placed 2 x BMS to distal RCA lesion. B. 08/2012 echo: preserved LV function with infer   DOE (dyspnea on exertion) 10/07/2019   Essential hypertension 09/09/2012   Mixed hyperlipidemia 09/09/2012   Formatting of this note might be different from the original. 03/2019: TC 171 LDL 94 HDL 55 TG 109   Obesity,  morbid ([facility]) 04/08/2018   Statin intolerance 10/07/2019   Type 2 diabetes mellitus ([facility]) 04/09/2018    Past Surgical History:  Procedure Laterality Date   BREAST EXCISIONAL BIOPSY Right 2010   Benign     Home Medications:  Prior to Admission medications   Medication Sig Start Date Mahmoud Blazejewski Date Taking? Authorizing Provider  aspirin 81 MG EC tablet Take by mouth. 04/08/19   [provider]  carvedilol (COREG) 25 MG tablet Take 1 tablet (25 mg total) by mouth 2 (two) times daily. 08/02/22   Antonieta Iba, MD  Coenzyme Q10 10 MG capsule Take by mouth.    [provider]  cyanocobalamin 1000 MCG tablet Take by mouth.    [provider]  ezetimibe (ZETIA) 10 MG tablet Take 1 tablet (10 mg total) by mouth daily. 08/02/22   Antonieta Iba, MD  isosorbide mononitrate (IMDUR) 30 MG 24 hr tablet Take 1 tablet (30 mg total) by mouth daily. 10/09/22   Antonieta Iba, MD  Multiple Vitamin (MULTI-VITAMIN) tablet Take 1 tablet by mouth daily.    [provider]  nitroGLYCERIN (NITROSTAT) 0.4 MG SL tablet Place 1 tablet (0.4 mg total) under the tongue every 5 (five) minutes as needed for chest pain. 08/02/22   Antonieta Iba, MD  Omega-3 Fatty Acids (OMEGA-3 2100) 1050 MG CAPS Take 1 tablet by mouth daily.    [provider]  valsartan (DIOVAN) 80 MG tablet Take 1 tablet (80 mg total) by mouth daily. 08/22/22  Antonieta Iba, MD    Inpatient Medications: Scheduled Meds:  fentaNYL       Continuous Infusions:  sodium chloride     heparin 1,100 Units/hr (04/08/23 2244)   PRN Meds: fentaNYL, nitroGLYCERIN  Allergies:    Allergies  Allergen Reactions   Lisinopril Swelling   Sulfa Antibiotics Itching    Social History:   Social History   Socioeconomic History   Marital status: Married    Spouse name: Not on file   Number of children: Not on file   Years of education: Not on file   Highest education level: Not on file  Occupational History    Not on file  Tobacco Use   Smoking status: Never   Smokeless tobacco: Never  Vaping Use   Vaping status: Never Used  Substance and Sexual Activity   Alcohol use: Yes    Alcohol/week: 1.0 standard drink of alcohol    Types: 1 Glasses of wine per week    Comment: states once or twice per year   Drug use: Never   Sexual activity: Not on file  Other Topics Concern   Not on file  Social History Narrative   Not on file   Social Determinants of Health   Financial Resource Strain: Not on file  Food Insecurity: Not on file  Transportation Needs: Not on file  Physical Activity: Not on file  Stress: Not on file  Social Connections: Not on file  Intimate Partner Violence: Not on file    Family History:   Family History  Problem Relation Age of Onset   Dementia Mother    Diabetes Mother    Thyroid disease Mother    Heart failure Father    Breast cancer Maternal Aunt      ROS:  Please see the history of present illness. All other ROS reviewed and negative.     Physical Exam/Data:   Vitals:   04/08/23 2212 04/08/23 2216  BP: (!) 171/106   Pulse: 76   Resp: 17   Temp: 97.8 F (36.6 C)   TempSrc: Oral   SpO2: 96%   Weight:  117.9 kg  Height:  5\' 4"  (1.626 m)   No intake or output data in the 24 hours ending 04/08/23 2252    04/08/2023   10:16 PM 10/16/2022    9:40 PM 08/02/2022    9:28 AM  Last 3 Weights  Weight (lbs) 260 lb 270 lb 272 lb 2 oz  Weight (kg) 117.935 kg 122.471 kg 123.435 kg     Body mass index is 44.63 kg/m.  General: Uncomfortable appearing woman, lying on stretcher in the emergency department.  Her daughters at the bedside. HEENT: normal Neck: Unable to assess JVP due to body habitus. Vascular: No carotid bruits; Distal pulses 2+ bilaterally Cardiac:  normal S1, S2; RRR; no murmur, rubs, or gallops. Lungs: Diminished breath sounds throughout without wheezes or crackles. Abd: soft, nontender, no hepatomegaly  Ext: no edema Musculoskeletal:  No  deformities, BUE and BLE strength normal and equal Skin: warm and dry  Neuro:  CNs 2-12 intact, no focal abnormalities noted Psych:  Normal affect   EKG:  The EKG was personally reviewed and demonstrates: Normal sinus rhythm with inferior ST elevation and reciprocal ST depressions in leads I, aVL, and V2. Telemetry:  Telemetry was personally reviewed and demonstrates: Normal sinus rhythm  Relevant CV Studies: None  Laboratory Data:  High Sensitivity Troponin:  No results for input(s): "TROPONINIHS" in the last 720  hours.   ChemistryNo results for input(s): "NA", "K", "CL", "CO2", "GLUCOSE", "BUN", "CREATININE", "CALCIUM", "MG", "GFRNONAA", "GFRAA", "ANIONGAP" in the last 168 hours.  No results for input(s): "PROT", "ALBUMIN", "AST", "ALT", "ALKPHOS", "BILITOT" in the last 168 hours. Lipids No results for input(s): "CHOL", "TRIG", "HDL", "LABVLDL", "LDLCALC", "CHOLHDL" in the last 168 hours.  Hematology Recent Labs  Lab 04/08/23 2208  WBC 9.8  RBC 5.11  HGB 14.7  HCT 44.8  MCV 87.7  MCH 28.8  MCHC 32.8  RDW 12.7  PLT 197   Thyroid No results for input(s): "TSH", "FREET4" in the last 168 hours.  BNPNo results for input(s): "BNP", "PROBNP" in the last 168 hours.  DDimer No results for input(s): "DDIMER" in the last 168 hours.   Radiology/Studies:  No results found.   Assessment and Plan:   Inferior STEMI Patient presents with acute onset of chest pain around 2:30 PM and has new inferior ST elevation on EKG.  She has known chronic total occlusion of the LCx as well as prior stents to the RCA.  Catheterization in 2016 reportedly showed moderate LAD and RCA disease as well.  Given continued chest pain, we have agreed to proceed with emergent cardiac catheterization and possible PCI.  The patient has provided emergent verbal consent.  Further recommendations to follow catheterization.  For questions or updates, please contact [facility] Please consult [email or web address]  for contact info under [facility].  Signed, Yvonne Kendall, MD  04/08/2023 10:52 PM

## 2023-04-08 NOTE — ED Notes (Signed)
Activated code stemi  

## 2023-04-08 NOTE — ED Notes (Signed)
Cardiologist at bedside at this time.

## 2023-04-08 NOTE — ED Notes (Addendum)
Secretary Rhasheena notified to active CODE STEMI at 22:18; Primary RN Coralee North notified CODE STEMI to be activated on patient and is going to room 6

## 2023-04-09 ENCOUNTER — Other Ambulatory Visit: Payer: Self-pay

## 2023-04-09 ENCOUNTER — Inpatient Hospital Stay (HOSPITAL_COMMUNITY)
Admit: 2023-04-09 | Discharge: 2023-04-09 | Disposition: A | Discharge status: Home / Self Care | Payer: Self-pay | Attending: Internal Medicine | Admitting: Internal Medicine

## 2023-04-09 ENCOUNTER — Encounter: Payer: Self-pay | Admitting: Internal Medicine

## 2023-04-09 DIAGNOSIS — I2119 ST elevation (STEMI) myocardial infarction involving other coronary artery of inferior wall: Secondary | ICD-10-CM

## 2023-04-09 DIAGNOSIS — E785 Hyperlipidemia, unspecified: Secondary | ICD-10-CM | POA: Diagnosis present

## 2023-04-09 DIAGNOSIS — I2510 Atherosclerotic heart disease of native coronary artery without angina pectoris: Secondary | ICD-10-CM

## 2023-04-09 DIAGNOSIS — I4729 Other ventricular tachycardia: Secondary | ICD-10-CM

## 2023-04-09 DIAGNOSIS — I10 Essential (primary) hypertension: Secondary | ICD-10-CM

## 2023-04-09 DIAGNOSIS — I2111 ST elevation (STEMI) myocardial infarction involving right coronary artery: Secondary | ICD-10-CM | POA: Insufficient documentation

## 2023-04-09 DIAGNOSIS — E119 Type 2 diabetes mellitus without complications: Secondary | ICD-10-CM

## 2023-04-09 LAB — COMPREHENSIVE METABOLIC PANEL
ALT: 45 U/L — ABNORMAL HIGH (ref 0–44)
AST: 65 U/L — ABNORMAL HIGH (ref 15–41)
Albumin: 3.3 g/dL — ABNORMAL LOW (ref 3.5–5.0)
Alkaline Phosphatase: 75 U/L (ref 38–126)
Anion gap: 10 (ref 5–15)
BUN: 21 mg/dL (ref 8–23)
CO2: 22 mmol/L (ref 22–32)
Calcium: 9.1 mg/dL (ref 8.9–10.3)
Chloride: 104 mmol/L (ref 98–111)
Creatinine, Ser: 0.63 mg/dL (ref 0.44–1.00)
GFR, Estimated: >60 mL/min (ref >60)
Glucose, Bld: 143 mg/dL — ABNORMAL HIGH (ref 70–99)
Potassium: 3.8 mmol/L (ref 3.5–5.1)
Sodium: 136 mmol/L (ref 135–145)
Total Bilirubin: 0.8 mg/dL (ref 0.3–1.2)
Total Protein: 6.8 g/dL (ref 6.5–8.1)

## 2023-04-09 LAB — ECHOCARDIOGRAM COMPLETE
AR max vel: 2.42 cm2
AV Area VTI: 2.57 cm2
AV Area mean vel: 2.17 cm2
AV Mean grad: 5 mmHg
AV Peak grad: 9.7 mmHg
Ao pk vel: 1.56 m/s
Area-P 1/2: 4.21 cm2
Calc EF: 56.4 %
Height: 64 in
MV VTI: 2.06 cm2
S' Lateral: 4 cm
Single Plane A2C EF: 60.1 %
Single Plane A4C EF: 50.7 %
Weight: 4465.64 oz

## 2023-04-09 LAB — CBC
HCT: 42.2 % (ref 36.0–46.0)
Hemoglobin: 13.9 g/dL (ref 12.0–15.0)
MCH: 28.4 pg (ref 26.0–34.0)
MCHC: 32.9 g/dL (ref 30.0–36.0)
MCV: 86.1 fL (ref 80.0–100.0)
Platelets: 190 10*3/uL (ref 150–400)
RBC: 4.9 MIL/uL (ref 3.87–5.11)
RDW: 12.7 % (ref 11.5–15.5)
WBC: 9.2 10*3/uL (ref 4.0–10.5)
nRBC: 0 % (ref 0.0–0.2)

## 2023-04-09 LAB — HEMOGLOBIN A1C
Hgb A1c MFr Bld: 7.3 % — ABNORMAL HIGH (ref 4.8–5.6)
Mean Plasma Glucose: 163 mg/dL

## 2023-04-09 LAB — GLUCOSE, CAPILLARY
Glucose-Capillary: 162 mg/dL — ABNORMAL HIGH (ref 70–99)
Glucose-Capillary: 163 mg/dL — ABNORMAL HIGH (ref 70–99)

## 2023-04-09 LAB — MAGNESIUM: Magnesium: 2.2 mg/dL (ref 1.7–2.4)

## 2023-04-09 LAB — PROTIME-INR
INR: 1.2 (ref 0.8–1.2)
Prothrombin Time: 15.3 seconds — ABNORMAL HIGH (ref 11.4–15.2)

## 2023-04-09 LAB — MRSA NEXT GEN BY PCR, NASAL: MRSA by PCR Next Gen: NOT DETECTED

## 2023-04-09 LAB — APTT: aPTT: 101 seconds — ABNORMAL HIGH (ref 24–36)

## 2023-04-09 LAB — HIV ANTIBODY (ROUTINE TESTING W REFLEX): HIV Screen 4th Generation wRfx: NONREACTIVE

## 2023-04-09 MED ORDER — ACETAMINOPHEN 325 MG PO TABS: 650.0000 mg | ORAL_TABLET | Freq: Four times a day (QID) | ORAL | Status: DC | PRN

## 2023-04-09 MED ORDER — SODIUM CHLORIDE 0.9 % IV SOLN: 250.0000 mL | INTRAVENOUS | Status: DC | PRN

## 2023-04-09 MED ORDER — HEPARIN SODIUM (PORCINE) 1000 UNIT/ML IJ SOLN
INTRAMUSCULAR | Status: AC
  Filled 2023-04-09: qty 10

## 2023-04-09 MED ORDER — ALUM & MAG HYDROXIDE-SIMETH 200-200-20 MG/5ML PO SUSP
15.0000 mL | Freq: Four times a day (QID) | ORAL | Status: DC | PRN
  Administered 2023-04-09: 15 mL via ORAL
  Filled 2023-04-09: qty 30

## 2023-04-09 MED ORDER — MAGNESIUM HYDROXIDE 400 MG/5ML PO SUSP: 30.0000 mL | Freq: Every day | ORAL | Status: DC | PRN

## 2023-04-09 MED ORDER — ONDANSETRON HCL 4 MG PO TABS: 4.0000 mg | ORAL_TABLET | Freq: Four times a day (QID) | ORAL | Status: DC | PRN

## 2023-04-09 MED ORDER — ACETAMINOPHEN 650 MG RE SUPP: 650.0000 mg | Freq: Four times a day (QID) | RECTAL | Status: DC | PRN

## 2023-04-09 MED ORDER — ENOXAPARIN SODIUM 40 MG/0.4ML IJ SOSY: 40.0000 mg | PREFILLED_SYRINGE | INTRAMUSCULAR | Status: DC

## 2023-04-09 MED ORDER — SODIUM CHLORIDE 0.9% FLUSH: 3.0000 mL | INTRAVENOUS | Status: DC | PRN

## 2023-04-09 MED ORDER — IRBESARTAN 150 MG PO TABS
75.0000 mg | ORAL_TABLET | Freq: Every day | ORAL | Status: DC
  Administered 2023-04-09 – 2023-04-10 (×2): 75 mg via ORAL
  Filled 2023-04-09 (×2): qty 1

## 2023-04-09 MED ORDER — ASPIRIN 81 MG PO TBEC
81.0000 mg | DELAYED_RELEASE_TABLET | Freq: Every day | ORAL | Status: DC
  Administered 2023-04-09 – 2023-04-10 (×2): 81 mg via ORAL
  Filled 2023-04-09 (×2): qty 1

## 2023-04-09 MED ORDER — ISOSORBIDE MONONITRATE ER 30 MG PO TB24
30.0000 mg | ORAL_TABLET | Freq: Every day | ORAL | Status: DC
  Administered 2023-04-09 – 2023-04-10 (×2): 30 mg via ORAL
  Filled 2023-04-09 (×2): qty 1

## 2023-04-09 MED ORDER — TRAZODONE HCL 50 MG PO TABS: 25.0000 mg | ORAL_TABLET | Freq: Every evening | ORAL | Status: DC | PRN

## 2023-04-09 MED ORDER — CARVEDILOL 12.5 MG PO TABS
12.5000 mg | ORAL_TABLET | Freq: Two times a day (BID) | ORAL | Status: DC
  Administered 2023-04-10: 12.5 mg via ORAL
  Filled 2023-04-09: qty 1

## 2023-04-09 MED ORDER — ENOXAPARIN SODIUM 60 MG/0.6ML IJ SOSY
60.0000 mg | PREFILLED_SYRINGE | INTRAMUSCULAR | Status: DC
  Administered 2023-04-09 – 2023-04-10 (×2): 60 mg via SUBCUTANEOUS
  Filled 2023-04-09 (×2): qty 0.6

## 2023-04-09 MED ORDER — ROSUVASTATIN CALCIUM 10 MG PO TABS
20.0000 mg | ORAL_TABLET | Freq: Every day | ORAL | Status: DC
  Administered 2023-04-09: 20 mg via ORAL
  Filled 2023-04-09 (×2): qty 2

## 2023-04-09 MED ORDER — ONDANSETRON HCL 4 MG/2ML IJ SOLN: 4.0000 mg | Freq: Four times a day (QID) | INTRAMUSCULAR | Status: DC | PRN

## 2023-04-09 MED ORDER — MORPHINE SULFATE (PF) 4 MG/ML IV SOLN: 2.0000 mg | INTRAVENOUS | Status: DC | PRN

## 2023-04-09 MED ORDER — CARVEDILOL 25 MG PO TABS
25.0000 mg | ORAL_TABLET | Freq: Two times a day (BID) | ORAL | Status: DC
  Administered 2023-04-09 (×2): 25 mg via ORAL
  Filled 2023-04-09 (×2): qty 1

## 2023-04-09 MED ORDER — ATROPINE SULFATE 1 MG/10ML IJ SOSY
PREFILLED_SYRINGE | INTRAMUSCULAR | Status: AC
  Filled 2023-04-09: qty 10

## 2023-04-09 MED ORDER — SODIUM CHLORIDE 0.9% FLUSH
3.0000 mL | Freq: Two times a day (BID) | INTRAVENOUS | Status: DC
  Administered 2023-04-09 – 2023-04-10 (×4): 3 mL via INTRAVENOUS

## 2023-04-09 MED ORDER — HYDRALAZINE HCL 20 MG/ML IJ SOLN: 10.0000 mg | INTRAMUSCULAR | Status: AC | PRN

## 2023-04-09 MED ORDER — IOHEXOL 300 MG/ML  SOLN
INTRAMUSCULAR | Status: DC | PRN
  Administered 2023-04-09: 153 mL

## 2023-04-09 MED ORDER — PRASUGREL HCL 10 MG PO TABS: 10.0000 mg | ORAL_TABLET | Freq: Every day | ORAL | Status: DC

## 2023-04-09 MED ORDER — ACETAMINOPHEN 325 MG PO TABS: 650.0000 mg | ORAL_TABLET | ORAL | Status: DC | PRN

## 2023-04-09 MED ORDER — INSULIN ASPART 100 UNIT/ML IJ SOLN: 0.0000 [IU] | Freq: Every day | INTRAMUSCULAR | Status: DC

## 2023-04-09 MED ORDER — EZETIMIBE 10 MG PO TABS
10.0000 mg | ORAL_TABLET | Freq: Every day | ORAL | Status: DC
  Administered 2023-04-09 – 2023-04-10 (×2): 10 mg via ORAL
  Filled 2023-04-09 (×2): qty 1

## 2023-04-09 MED ORDER — SODIUM CHLORIDE 0.9 % IV SOLN: INTRAVENOUS | Status: DC

## 2023-04-09 MED ORDER — PRASUGREL HCL 10 MG PO TABS
10.0000 mg | ORAL_TABLET | Freq: Every day | ORAL | Status: DC
  Administered 2023-04-10: 10 mg via ORAL
  Filled 2023-04-09: qty 1

## 2023-04-09 MED ORDER — INSULIN ASPART 100 UNIT/ML IJ SOLN
0.0000 [IU] | Freq: Three times a day (TID) | INTRAMUSCULAR | Status: DC
  Administered 2023-04-09: 2 [IU] via SUBCUTANEOUS
  Administered 2023-04-09: 5 [IU] via SUBCUTANEOUS
  Administered 2023-04-09 – 2023-04-10 (×2): 2 [IU] via SUBCUTANEOUS
  Filled 2023-04-09 (×4): qty 1

## 2023-04-09 NOTE — Progress Notes (Signed)
*  PRELIMINARY RESULTS* Echocardiogram 2D Echocardiogram has been performed.  Carolyne Fiscal 04/09/2023, 10:08 AM

## 2023-04-09 NOTE — Assessment & Plan Note (Addendum)
-   Continued on home regimen

## 2023-04-09 NOTE — Progress Notes (Signed)
PHARMACIST - PHYSICIAN COMMUNICATION  CONCERNING:  Enoxaparin (Lovenox) for DVT Prophylaxis    RECOMMENDATION: Patient was prescribed enoxaprin 40mg  q24 hours for VTE prophylaxis.   Filed Weights   04/08/23 2216  Weight: 117.9 kg (260 lb)    Body mass index is 44.63 kg/m.  Estimated Creatinine Clearance: 89.8 mL/min (by C-G formula based on SCr of 0.81 mg/dL).   Based on [facility] policy patient is candidate for enoxaparin 0.5mg /kg TBW SQ every 24 hours based on BMI being >30.  DESCRIPTION: Pharmacy has adjusted enoxaparin dose per [facility] policy.  Patient is now receiving enoxaparin 0.5 mg/kg every 24 hours   Otelia Sergeant, PharmD, [facility] 04/09/2023 12:39 AM

## 2023-04-09 NOTE — Progress Notes (Signed)
   04/09/23 1600  Spiritual Encounters  Type of Visit Follow up  Care provided to: Patient  Referral source Other (comment)  Reason for visit Routine spiritual support  OnCall Visit Yes   Chaplain followed up on patient from previous night's stemi code.

## 2023-04-09 NOTE — H&P (Signed)
[place]   PATIENT NAME: Denise [name]    MR#:  [id number]  DATE OF BIRTH:  May 26, 1961  DATE OF ADMISSION:  04/08/2023  PRIMARY CARE PHYSICIAN: Patrice Paradise, MD   Patient is coming from: Home  REQUESTING/REFERRING PHYSICIAN: End, Cristal Deer, MD  CHIEF COMPLAINT:   Chief Complaint  Patient presents with   Chest Pain    HISTORY OF PRESENT ILLNESS:  Denise [name] is a 62 y.o. Caucasian female with medical history significant for coronary artery disease status post PCI and stents at [facility], hypertension, type diabetes mellitus and dyslipidemia, who presented to the emergency room with acute onset of midsternal chest pain felt as pressure and graded 7-8/10 in severity with radiation to the right jaw and associated dyspnea and palpitations.  She denied any nausea or vomiting or diaphoresis.  No cough or wheezing or hemoptysis.  She has bilateral lower extremity edema with occasional pain without recent surgeries or travels.  Blood glucose levels have been running high.  No dysuria, oliguria or hematuria or flank pain.  No bleeding diathesis.  ED Course: When she came to the ER, vital signs were within normal.  Labs revealed high-sensitivity troponin I of 392 and later 1509.  BMP showed BUN of 25 and glucose 196 and CBC was within normal.  LDL was 124 and total cholesterol 188 with HDL 52 and VLDL of 12 with triglycerides of 59. EKG as reviewed by me : EKG showed sinus rhythm with a rate of 72 with first-degree AV block, left axis deviation, Q waves anteroseptally and inferiorly. Imaging: None.  The patient was given IV heparin bolus and drip as well as 50 mcg of IV fentanyl and 4 baby aspirin.  She was taken to the Cath Lab by Dr. Okey Dupre and underwent PCI as well as stent in ostial RCA lesion and another stent and a total occlusion in the mid RCA.  She was admitted to an ICU bed for further evaluation and management.   PAST MEDICAL HISTORY:   Past Medical  History:  Diagnosis Date   CAD (coronary artery disease) 09/09/2012   Formatting of this note might be different from the original. A. 08/2012 STEMI. Cath: nl left main, L Cx with prox 70% stenosis, diffuse 60-80% stenosis in mid segment, LAD with 90% in the apical LAD, 60% ostial RCA, and 100% thrombotic occlusion to the distal RCA that was suspected to be culprit lesion. Cath team placed 2 x BMS to distal RCA lesion. B. 08/2012 echo: preserved LV function with infer   DOE (dyspnea on exertion) 10/07/2019   Essential hypertension 09/09/2012   Mixed hyperlipidemia 09/09/2012   Formatting of this note might be different from the original. 03/2019: TC 171 LDL 94 HDL 55 TG 109   Obesity, morbid (HCC) 04/08/2018   Statin intolerance 10/07/2019   Type 2 diabetes mellitus (HCC) 04/09/2018    PAST SURGICAL HISTORY:   Past Surgical History:  Procedure Laterality Date   BREAST EXCISIONAL BIOPSY Right 2010   Benign    SOCIAL HISTORY:   Social History   Tobacco Use   Smoking status: Never   Smokeless tobacco: Never  Substance Use Topics   Alcohol use: Yes    Alcohol/week: 1.0 standard drink of alcohol    Types: 1 Glasses of wine per week    Comment: states once or twice per year    FAMILY HISTORY:   Family History  Problem Relation Age of Onset  Dementia Mother    Diabetes Mother    Thyroid disease Mother    Heart failure Father    Breast cancer Maternal Aunt     DRUG ALLERGIES:   Allergies  Allergen Reactions   Lisinopril Swelling   Sulfa Antibiotics Itching    REVIEW OF SYSTEMS:   ROS As per history of present illness. All pertinent systems were reviewed above. Constitutional, HEENT, cardiovascular, respiratory, GI, GU, musculoskeletal, neuro, psychiatric, endocrine, integumentary and hematologic systems were reviewed and are otherwise negative/unremarkable except for positive findings mentioned above in the HPI.   MEDICATIONS AT HOME:   Prior to Admission medications    Medication Sig Start Date End Date Taking? Authorizing Provider  aspirin 81 MG EC tablet Take by mouth. 04/08/19   [provider]  carvedilol (COREG) 25 MG tablet Take 1 tablet (25 mg total) by mouth 2 (two) times daily. 08/02/22   Antonieta Iba, MD  Coenzyme Q10 10 MG capsule Take by mouth.    [provider]  cyanocobalamin 1000 MCG tablet Take by mouth.    [provider]  ezetimibe (ZETIA) 10 MG tablet Take 1 tablet (10 mg total) by mouth daily. 08/02/22   Antonieta Iba, MD  isosorbide mononitrate (IMDUR) 30 MG 24 hr tablet Take 1 tablet (30 mg total) by mouth daily. 10/09/22   Antonieta Iba, MD  Multiple Vitamin (MULTI-VITAMIN) tablet Take 1 tablet by mouth daily.    [provider]  nitroGLYCERIN (NITROSTAT) 0.4 MG SL tablet Place 1 tablet (0.4 mg total) under the tongue every 5 (five) minutes as needed for chest pain. 08/02/22   Antonieta Iba, MD  Omega-3 Fatty Acids (OMEGA-3 2100) 1050 MG CAPS Take 1 tablet by mouth daily.    [provider]  valsartan (DIOVAN) 80 MG tablet Take 1 tablet (80 mg total) by mouth daily. 08/22/22   Antonieta Iba, MD      VITAL SIGNS:  Blood pressure (!) 141/74, pulse 64, temperature 98.2 F (36.8 C), temperature source Oral, resp. rate (!) 25, height 5\' 4"  (1.626 m), weight 126.6 kg, SpO2 98%.  PHYSICAL EXAMINATION:  Physical Exam  GENERAL:  62 y.o.-year-old, Cauca IV heparin sian female patient lying in the bed with no acute distress.  EYES: Pupils equal, round, reactive to light and accommodation. No scleral icterus. Extraocular muscles intact.  HEENT: Head atraumatic, normocephalic. Oropharynx and nasopharynx clear.  NECK:  Supple, no jugular venous distention. No thyroid enlargement, no tenderness.  LUNGS: Normal breath sounds bilaterally, no wheezing, rales,rhonchi or crepitation. No use of accessory muscles of respiration.  CARDIOVASCULAR: Regular rate and rhythm, S1, S2 normal. No  murmurs, rubs, or gallops.  ABDOMEN: Soft, nondistended, nontender. Bowel sounds present. No organomegaly or mass.  EXTREMITIES: No pedal edema, cyanosis, or clubbing.  NEUROLOGIC: Cranial nerves II through XII are intact. Muscle strength 5/5 in all extremities. Sensation intact. Gait not checked.  PSYCHIATRIC: The patient is alert and oriented x 3.  Normal affect and good eye contact. SKIN: No obvious rash, lesion, or ulcer.   LABORATORY PANEL:   CBC Recent Labs  Lab 04/08/23 2208  WBC 9.8  HGB 14.7  HCT 44.8  PLT 197   ------------------------------------------------------------------------------------------------------------------  Chemistries  Recent Labs  Lab 04/08/23 2208  NA 136  K 3.9  CL 101  CO2 26  GLUCOSE 196*  BUN 25*  CREATININE 0.81  CALCIUM 9.7   ------------------------------------------------------------------------------------------------------------------  Cardiac Enzymes No results for input(s): "TROPONINI" in the last 168  hours. ------------------------------------------------------------------------------------------------------------------  RADIOLOGY:  CARDIAC CATHETERIZATION  Result Date: 04/09/2023 Conclusions: Severe three-vessel coronary artery disease, including chronic total occlusions of the distal LAD and proximal LCx as well as sequential 90% ostial RCA and 100% mid RCA lesions (thrombotic occlusion of the mid RCA stent is the culprit for the patient's inferior STEMI). Patent proximal RCA bare-metal stent with approximately 20% diffuse in-stent restenosis. Normal left ventricular systolic function (LVEF 55-65%) with mildly elevated filling pressure (LVEDP 15 mmHg). Successful PCI to thrombotic occlusion of mid RCA using Onyx Frontier 3.5 x 34 mm drug-eluting stent (covers entire old mid RCA bare-metal stent) with 0% residual stenosis and TIMI-3 flow. Successful PCI to ostial RCA stenosis using Onyx Frontier 4.0 x 15 mm drug-eluting stent with  0% residual stenosis and TIMI-3 flow. Recommendations: Dual antiplatelet therapy with aspirin and prasugrel for at least 12 months. Aggressive secondary prevention of coronary artery disease; will need to readdress high intensity statin therapy given reported history of statin intolerance in the past versus outpatient initiation of PCSK9 inhibitor therapy. Obtain echocardiogram. Medical management of chronic total occlusions of distal LAD and proximal LCx. Yvonne Kendall, MD [facility]     IMPRESSION AND PLAN:  Assessment and Plan: * Acute ST elevation myocardial infarction (STEMI) of inferior wall (HCC) - The patient is status post PCI and 2 RCA stents. - She is admitted to an ICU bed. - She will be placed on dual antiplatelet therapy per cardiology. - High-dose statin and beta-blocker therapy will be provided. - We will place her on as needed sublingual nitroglycerin and IV morphine sulfate for pain. - Follow-up cardiology consult will be obtained by [facility]  Dyslipidemia - The patient will be placed on high-dose statin therapy and will continue Zetia.  Type 2 diabetes mellitus without complications (HCC) - Patient will be placed on supplemental coverage with NovoLog.  Essential hypertension - We will continue her antihypertensive therapy.   DVT prophylaxis: IV heparin. Advanced Care Planning:  Code Status: full code. Family Communication:  The plan of care was discussed in details with the patient (and family). I answered all questions. The patient agreed to proceed with the above mentioned plan. Further management will depend upon hospital course. Disposition Plan: Back to previous home environment Consults called: Cardiology. All the records are reviewed and case discussed with ED provider.  Status is: Inpatient   At the time of the admission, it appears that the appropriate admission status for this patient is inpatient.  This is judged to be reasonable and necessary in  order to provide the required intensity of service to ensure the patient's safety given the presenting symptoms, physical exam findings and initial radiographic and laboratory data in the context of comorbid conditions.  The patient requires inpatient status due to high intensity of service, high risk of further deterioration and high frequency of surveillance required.  I certify that at the time of admission, it is my clinical judgment that the patient will require inpatient hospital care extending more than 2 midnights.                            Dispo: The patient is from: Home              Anticipated d/c is to: Home              Patient currently is not medically stable to d/c.  Difficult to place patient: No  Hannah Beat M.D on 04/09/2023 at 3:20 AM  [facility]   From 7 PM-7 AM, contact night-coverage [email or web address]  CC: Primary care physician; Patrice Paradise, MD

## 2023-04-09 NOTE — Assessment & Plan Note (Addendum)
Presented with acute onset chest pain found to have new inferior ST elevation on EKG.  High sensitivity troponin peaked at 1509.  Previous heart catheterization in 2016 showed moderate LAD and RCA disease, prior STEMI in 2014. Left heart Cath showed three-vessel CAD with total occlusions of the distal LAD and proximal left circumflex as well as sequential 90% ostial RCA and 100% mid RCA lesions thrombotic occlusion of the mid RCA stent was the culprit for the patient's inferior STEMI -- DAPT - aspirin and prasugrel x at least 12 months -- Echocardiogram revealed normal LVEF -- Aggressive secondary prevention of CAD -- Continue on ezetimibe and rosuvastatin -- Continued on carvedilol 12.5 mg daily -- Cardiac rehab referral -- Cardiology to arrange follow up

## 2023-04-09 NOTE — ED Provider Notes (Signed)
[facility] Provider Note    Event Date/Time   First MD Initiated Contact with Patient 04/08/23 2238     (approximate)   History   Chest Pain   HPI  Denise [name] is a 62 y.o. female with history of CAD with prior stents, hypertension, hyperlipidemia presenting to the emergency department for evaluation of chest pain.  EKG obtained in triage with acute ST changes, activated as a code STEMI.  On my evaluation, patient reports that around 2:30 PM she developed central chest pressure with pain in her left jaw that progressed to the afternoon.  Reports this does feel similar to when she has previously had an MRI.     Physical Exam   Triage Vital Signs: ED Triage Vitals  Encounter Vitals Group     BP 04/08/23 2212 (!) 171/106     Systolic BP Percentile --      Diastolic BP Percentile --      Pulse Rate 04/08/23 2212 76     Resp 04/08/23 2212 17     Temp 04/08/23 2212 97.8 F (36.6 C)     Temp Source 04/08/23 2212 Oral     SpO2 04/08/23 2212 96 %     Weight 04/08/23 2216 260 lb (117.9 kg)     Height 04/08/23 2216 5\' 4"  (1.626 m)     Head Circumference --      Peak Flow --      Pain Score 04/08/23 2306 4     Pain Loc --      Pain Education --      Exclude from Growth Chart --     Most recent vital signs: Vitals:   04/08/23 2212  BP: (!) 171/106  Pulse: 76  Resp: 17  Temp: 97.8 F (36.6 C)  SpO2: 96%     General: Awake, interactive  CV:  Regular rate, good peripheral perfusion.  Resp:  Lungs clear, unlabored respirations.  Abd:  Soft, nondistended.  Neuro:  Symmetric facial movement, fluid speech   ED Results / Procedures / Treatments   Labs (all labs ordered are listed, but only abnormal results are displayed) Labs Reviewed  BASIC METABOLIC PANEL - Abnormal; Notable for the following components:      Result Value   Glucose, Bld 196 (*)    BUN 25 (*)    All other components within normal limits  TROPONIN I (HIGH SENSITIVITY)  - Abnormal; Notable for the following components:   Troponin I (High Sensitivity) 392 (*)    All other components within normal limits  CBC  CBC WITH DIFFERENTIAL/PLATELET  PROTIME-INR  APTT  COMPREHENSIVE METABOLIC PANEL  LIPID PANEL  TROPONIN I (HIGH SENSITIVITY)     EKG EKG independently reviewed interpreted by myself (ER attending) demonstrates:  Initial EKG from 2211 demonstrates sinus rhythm rate of 72, PR 220, QRS 110, QTc 431, ST elevation in the inferior leads with perhaps subtle depression in the lateral leads, repeat EKGs with more prominent ST changes.  RADIOLOGY Imaging independently reviewed and interpreted by myself demonstrates:    PROCEDURES:  Critical Care performed: Yes, see critical care procedure note(s)  CRITICAL CARE Performed by: Trinna Post   Total critical care time: 31 minutes  Critical care time was exclusive of separately billable procedures and treating other patients.  Critical care was necessary to treat or prevent imminent or life-threatening deterioration.  Critical care was time spent personally by me on the following activities: development of treatment plan with  patient and/or surrogate as well as nursing, discussions with consultants, evaluation of patient's response to treatment, examination of patient, obtaining history from patient or surrogate, ordering and performing treatments and interventions, ordering and review of laboratory studies, ordering and review of radiographic studies, pulse oximetry and re-evaluation of patient's condition.   Procedures   MEDICATIONS ORDERED IN ED: Medications  0.9 %  sodium chloride infusion (has no administration in time range)  heparin ADULT infusion 100 units/mL (25000 units/222mL) (1,100 Units/hr Intravenous New Bag/Given 04/08/23 2244)  fentaNYL (SUBLIMAZE) 50 MCG/ML injection (has no administration in time range)  nitroGLYCERIN (NITROSTAT) SL tablet 0.4 mg ( Sublingual MAR Hold 04/08/23 2257)   midazolam (VERSED) injection (1 mg Intravenous Given 04/08/23 2303)  fentaNYL (SUBLIMAZE) injection (25 mcg Intravenous Given 04/08/23 2303)  verapamil (ISOPTIN) injection (2.5 mg Intra-arterial Given 04/08/23 2358)  Heparin (Porcine) in NaCl 2000-0.9 UNIT/L-% SOLN (1,000 mLs  Given 04/08/23 2304)  heparin sodium (porcine) injection (2,000 Units Intravenous Given 04/09/23 0004)  prasugrel (EFFIENT) tablet (60 mg Oral Given 04/08/23 2315)  atropine 1 MG/10ML injection (0.5 mg Intravenous Given 04/08/23 2316)  iohexol (OMNIPAQUE) 300 MG/ML solution (153 mLs  Given 04/09/23 0004)  prasugrel (EFFIENT) tablet 10 mg (has no administration in time range)  aspirin chewable tablet 324 mg (324 mg Oral Given 04/08/23 2230)  heparin injection 4,000 Units (4,000 Units Intravenous Given by Other 04/08/23 2233)  fentaNYL (SUBLIMAZE) injection 50 mcg (50 mcg Intravenous Given 04/08/23 2235)     IMPRESSION / MDM / ASSESSMENT AND PLAN / ED COURSE  I reviewed the triage vital signs and the nursing notes.  Differential diagnosis includes, but is not limited to, STEMI, clinical history not suggestive of pulmonary embolism, aortic dissection  Patient's presentation is most consistent with acute presentation with potential threat to life or bodily function.  62 year old female presenting with chest pain and left jaw pain starting earlier today with known cardiac history.  Active chest pain on presentation.  Initial EKG demonstrating ST elevation in the inferior leads meeting STEMI criteria.  Code STEMI was activated.  Repeat EKG demonstrating worsening ST elevations in the inferior leads as well as developing depressions in the lateral leads.  She was given 325 aspirin and a heparin bolus.  Patient was evaluated by Dr. Okey Dupre.  She was taken to the Cath Lab for further management.      FINAL CLINICAL IMPRESSION(S) / ED DIAGNOSES   Final diagnoses:  ST elevation myocardial infarction (STEMI), unspecified artery (HCC)      Rx / DC Orders   ED Discharge Orders          Ordered    AMB Referral to Cardiac Rehabilitation - Phase II        04/09/23 0008             Note:  This document was prepared using Dragon voice recognition software and may include unintentional dictation errors.   Trinna Post, MD 04/09/23 [phone]

## 2023-04-09 NOTE — Progress Notes (Signed)
Brief rounding note, same day as admission  HPI: Pt admitted after midnight following cardiac cath with two stent placements, after presenting with chest pain, dyspnea and palpitation and found to have inferior STEMI.  Taken emergently to cath lab.  See full H&P and Cardiology consult notes for full HPI on admission.  Interval history: Pt seen in ICU this AM, awake resting in bed.  She reports some intermittent chest pressure, but denies chest pain like she'd been having yesterday.    Exam: General exam: awake, alert, no acute distress, obese HEENT: atraumatic, clear conjunctiva, anicteric sclera, moist mucus membranes, hearing grossly normal  Respiratory system: CTAB, no wheezes, rales or rhonchi, normal respiratory effort. Cardiovascular system: normal S1/S2, RRR, no JVD, murmurs, rubs, gallops, no pedal edema.   Gastrointestinal system: soft, NT, ND, no HSM felt, +bowel sounds. Central nervous system: A&O x4. no gross focal neurologic deficits, normal speech Extremities: moves all, no edema, normal tone Skin: dry, intact, normal temperature, normal color Psychiatry: normal mood, congruent affect, judgement and insight appear normal   A&P: as per H&P by Dr. Arville Care, with any changes or additions as below:  --follow up Cardiology's recommendations --Echocardiogram pending - follow --Out of bed to chair     No charge

## 2023-04-09 NOTE — Progress Notes (Signed)
eLink Physician-Brief Progress Note Patient Name: Denise [name] DOB: May 20, 1961 MRN: [id number]   Date of Service  04/09/2023  HPI/Events of Note  57F who presented with chest pain and EKG concerning for STEMI. Cardiology admitted and patient underwent cardiac cath which showed 3vdisease, patent pRCA with BMS, normal EF. S/p PCI to [facility] and ostial RCA  In the ICU patient on room air. No pressor needs.   eICU Interventions  GDMT per Cardiology Elink available as needed        [name] Mechele Collin 04/09/2023, 12:50 AM

## 2023-04-09 NOTE — Assessment & Plan Note (Addendum)
-   Patient will be placed on supplemental coverage with NovoLog.

## 2023-04-09 NOTE — Progress Notes (Signed)
Cardiology aware of run of Vtach. Magnesium added on to labs for this a.m.

## 2023-04-09 NOTE — Progress Notes (Signed)
Rounding Note    Patient Name: Denise [name] Date of Encounter: 04/09/2023  [facility] Cardiologist: Julien Nordmann, MD   Subjective   Patient seen on rounds.  Denies any chest pain or shortness of breath.  Right radial cath site remained stable.  Inpatient Medications    Scheduled Meds:  aspirin EC  81 mg Oral Daily   carvedilol  25 mg Oral BID WC   enoxaparin (LOVENOX) injection  60 mg Subcutaneous Q24H   ezetimibe  10 mg Oral Daily   fentaNYL       insulin aspart  0-15 Units Subcutaneous TID WC   insulin aspart  0-5 Units Subcutaneous QHS   irbesartan  75 mg Oral Daily   isosorbide mononitrate  30 mg Oral Daily   [START ON 04/10/2023] prasugrel  10 mg Oral Daily   sodium chloride flush  3 mL Intravenous Q12H   Continuous Infusions:  sodium chloride     sodium chloride     sodium chloride 100 mL/hr at 04/09/23 0600   PRN Meds: sodium chloride, acetaminophen **OR** acetaminophen, fentaNYL, magnesium hydroxide, morphine injection, nitroGLYCERIN, ondansetron **OR** ondansetron (ZOFRAN) IV, sodium chloride flush, traZODone   Vital Signs    Vitals:   04/09/23 0430 04/09/23 0500 04/09/23 0600 04/09/23 0700  BP: 126/88 (!) 148/107 139/76 129/76  Pulse: 66 66 66 61  Resp: 16 14 16 13   Temp:      TempSrc:      SpO2: 98% 98% 98% 98%  Weight:      Height:        Intake/Output Summary (Last 24 hours) at 04/09/2023 0802 Last data filed at 04/09/2023 0600 Gross per 24 hour  Intake 524.53 ml  Output 750 ml  Net -225.47 ml      04/09/2023   12:45 AM 04/08/2023   10:16 PM 10/16/2022    9:40 PM  Last 3 Weights  Weight (lbs) 279 lb 1.6 oz 260 lb 270 lb  Weight (kg) 126.6 kg 117.935 kg 122.471 kg      Telemetry    Sinus rates of 60-70 with one episode of a 35 beat run of nonsustained V. tach- Personally Reviewed  ECG    No new tracings- Personally Reviewed  Physical Exam   GEN: No acute distress.   Neck: Unable to assess JVD due to body  habitus Cardiac: RRR, no murmurs, rubs, or gallops.  Right radial cath site with gauze and tape dressing intact, 2+ radial pulse, no bleeding, bruising, or hematoma Respiratory: Clear to auscultation bilaterally.  Rations are unlabored on room air GI: Soft, nontender, non-distended  MS: No edema; No deformity. Neuro:  Nonfocal  Psych: Normal affect   Labs    High Sensitivity Troponin:   Recent Labs  Lab 04/08/23 2208 04/09/23 0055  TROPONINIHS 392* 1,509*     Chemistry Recent Labs  Lab 04/08/23 2208 04/09/23 0344  NA 136 136  K 3.9 3.8  CL 101 104  CO2 26 22  GLUCOSE 196* 143*  BUN 25* 21  CREATININE 0.81 0.63  CALCIUM 9.7 9.1  PROT  --  6.8  ALBUMIN  --  3.3*  AST  --  65*  ALT  --  45*  ALKPHOS  --  75  BILITOT  --  0.8  GFRNONAA >60 >60  ANIONGAP 9 10    Lipids  Recent Labs  Lab 04/09/23 0055  CHOL 188  TRIG 59  HDL 52  LDLCALC 124*  CHOLHDL 3.6  Hematology Recent Labs  Lab 04/08/23 2208 04/09/23 0344  WBC 9.8 9.2  RBC 5.11 4.90  HGB 14.7 13.9  HCT 44.8 42.2  MCV 87.7 86.1  MCH 28.8 28.4  MCHC 32.8 32.9  RDW 12.7 12.7  PLT 197 190   Thyroid No results for input(s): "TSH", "FREET4" in the last 168 hours.  BNPNo results for input(s): "BNP", "PROBNP" in the last 168 hours.  DDimer No results for input(s): "DDIMER" in the last 168 hours.   Radiology      Cardiac Studies  LHC 04/08/23 Conclusions: Severe three-vessel coronary artery disease, including chronic total occlusions of the distal LAD and proximal LCx as well as sequential 90% ostial RCA and 100% mid RCA lesions (thrombotic occlusion of the mid RCA stent is the culprit for the patient's inferior STEMI). Patent proximal RCA bare-metal stent with approximately 20% diffuse in-stent restenosis. Normal left ventricular systolic function (LVEF 55-65%) with mildly elevated filling pressure (LVEDP 15 mmHg). Successful PCI to thrombotic occlusion of mid RCA using Onyx Frontier 3.5 x 34 mm  drug-eluting stent (covers entire old mid RCA bare-metal stent) with 0% residual stenosis and TIMI-3 flow. Successful PCI to ostial RCA stenosis using Onyx Frontier 4.0 x 15 mm drug-eluting stent with 0% residual stenosis and TIMI-3 flow.   Recommendations: Dual antiplatelet therapy with aspirin and prasugrel for at least 12 months. Aggressive secondary prevention of coronary artery disease; will need to readdress high intensity statin therapy given reported history of statin intolerance in the past versus outpatient initiation of PCSK9 inhibitor therapy. Obtain echocardiogram. Medical management of chronic total occlusions of distal LAD and proximal LCx.  Patient Profile     62 y.o. female with a past medical history of coronary artery disease with remote bare-metal stents to the RCA and known coronary chronic total occlusion of the left circumflex, hypertension, hyperlipidemia, borderline diabetes who is being seen and evaluated for chest pain and abnormal EKG  Assessment & Plan    Inferior STEMI with known history of CAD -Presented with the acute onset of chest pain -New inferior ST elevation on EKG -Previous heart catheterization in 2016 showed moderate LAD and RCA disease, prior STEMI in 2014 -Was taken for emergent left heart catheterization on 04/08/2023 -Cath revealed severe three-vessel coronary artery disease including chronic total occlusions to the distal LAD and proximal left circumflex as well as sequential 90% ostial RCA and 100% mid RCA lesions thrombotic occlusion of the mid RCA stent is the culprit for the patient's inferior STEMI -She will continue on DAPT with aspirin and prasugrel for minimum of 12 months -Echocardiogram ordered and pending with further recommendations to follow -Aggressive secondary prevention of coronary artery disease -Continues to remain chest pain-free this morning -Nonsustained V. tach episode noted on monitor this morning versus reperfusion  arrhythmia, patient remained asymptomatic, potassium 3.8 and magnesium added on to labs with a.m. carvedilol to be given -Continue on ezetimibe -May benefit from PCSK9 inhibitor, previously declined in the past -Continue with telemetry monitoring -EKG as needed for pain or changes -Out of bed to chair today -Cardiac rehab on discharge  Hypertension -Blood pressure 128/71 -Continued on carvedilol, irbesartan, and Imdur -Vital signs per unit protocol  Hyperlipidemia -LDL 124 -LP(a) pending -Continued on ezetimibe 10 mg daily -Recommend trialing on statin therapy and rediscussing PCSK9 inhibitors if she continues to decline  4.   Diabetes -Continued on insulin -Management per IM  For questions or updates, please contact [facility] Please consult [email or web address] for contact info  under        Signed, Burl Tauzin, NP  04/09/2023, 8:02 AM

## 2023-04-09 NOTE — Plan of Care (Signed)
  Problem: Education: Goal: Knowledge of General Education information will improve Description Including pain rating scale, medication(s)/side effects and non-pharmacologic comfort measures Outcome: Progressing   

## 2023-04-09 NOTE — Assessment & Plan Note (Addendum)
-   The patient will be placed on high-dose statin therapy and will continue Zetia.

## 2023-04-09 NOTE — Progress Notes (Signed)
Pt transferred to 248 at this time. VSS prior to transfer.

## 2023-04-09 NOTE — Progress Notes (Signed)
   04/08/23 2100  Spiritual Encounters  Type of Visit Initial  Care provided to: Family  Referral source Physician  Reason for visit Code  OnCall Visit Yes  Spiritual Framework  Family Stress Factors Family relationships  Interventions  Spiritual Care Interventions Made Compassionate presence  Intervention Outcomes  Outcomes Reduced anxiety   Chaplain responded to Code Stemi providing presence for daughter of patient.

## 2023-04-10 LAB — GLUCOSE, CAPILLARY: Glucose-Capillary: 148 mg/dL — ABNORMAL HIGH (ref 70–99)

## 2023-04-10 MED ORDER — ROSUVASTATIN CALCIUM 20 MG PO TABS: 20.0000 mg | ORAL_TABLET | Freq: Every day | ORAL | 1 refills | Status: DC

## 2023-04-10 MED ORDER — CARVEDILOL 6.25 MG PO TABS: 6.2500 mg | ORAL_TABLET | Freq: Two times a day (BID) | ORAL | 1 refills | Status: DC

## 2023-04-10 MED ORDER — NITROGLYCERIN 0.4 MG SL SUBL: 0.4000 mg | SUBLINGUAL_TABLET | SUBLINGUAL | 0 refills | Status: AC | PRN

## 2023-04-10 MED ORDER — PRASUGREL HCL 10 MG PO TABS: 10.0000 mg | ORAL_TABLET | Freq: Every day | ORAL | 1 refills | Status: DC

## 2023-04-10 MED ORDER — CARVEDILOL 6.25 MG PO TABS: 6.2500 mg | ORAL_TABLET | Freq: Two times a day (BID) | ORAL | Status: DC

## 2023-04-10 NOTE — Progress Notes (Signed)
Patient is ready for discharge. I have reviewed all discharge instructions with patient. She understands what she needs to do regarding instructions and medications. I have answered all questions patient had regarding discharge instructions. Patient has all of her belongings packed up and ready to go with her. Her IV has been removed without complication and she has been taken off  telemetry. Patient will be transported home by her two daughters who are here with her now.

## 2023-04-10 NOTE — Discharge Summary (Signed)
Physician Discharge Summary   Patient: Denise [name] MRN: [id number] DOB: 11/21/1960  Admit date:     04/08/2023  Discharge date: 04/19/23  Discharge Physician: Pennie Banter   PCP: Patrice Paradise, MD   Recommendations at discharge:   Follow up with Cardiology Follow up with Primary Care Repeat BMP, CBC, Mg in 1-2 weeks  Discharge Diagnoses: Principal Problem:   Acute ST elevation myocardial infarction (STEMI) of inferior wall (HCC) Active Problems:   Dyslipidemia   Type 2 diabetes mellitus without complications (HCC)   Essential hypertension   NSVT (nonsustained ventricular tachycardia) (HCC)  Resolved Problems:   * No resolved hospital problems. [facility] Course: HPI on admission: "[name] is a 62 y.o. Caucasian female with medical history significant for coronary artery disease status post PCI and stents at [facility], hypertension, type diabetes mellitus and dyslipidemia, who presented to the emergency room with acute onset of midsternal chest pain felt as pressure and graded 7-8/10 in severity with radiation to the right jaw and associated dyspnea and palpitations.  She denied any nausea or vomiting or diaphoresis.  No cough or wheezing or hemoptysis.  She has bilateral lower extremity edema with occasional pain without recent surgeries or travels.  Blood glucose levels have been running high.  No dysuria, oliguria or hematuria or flank pain.  No bleeding diathesis.   ED Course: When she came to the ER, vital signs were within normal.  Labs revealed high-sensitivity troponin I of 392 and later 1509.  BMP showed BUN of 25 and glucose 196 and CBC was within normal.  LDL was 124 and total cholesterol 188 with HDL 52 and VLDL of 12 with triglycerides of 59. EKG as reviewed by me : EKG showed sinus rhythm with a rate of 72 with first-degree AV block, left axis deviation, Q waves anteroseptally and inferiorly. Imaging: None.   The patient was given IV  heparin bolus and drip as well as 50 mcg of IV fentanyl and 4 baby aspirin.  She was taken to the Cath Lab by Dr. Okey Dupre and underwent PCI as well as stent in ostial RCA lesion and another stent and a total occlusion in the mid RCA.  She was admitted to an ICU bed for further evaluation and management."  Further hospital course and management as outlined below, and in Cardiology's notes.  8/13 -- pt doing well, denies chest pain.  Ambulating without symptoms.  Cleared by Cardiology and medically stable for discharge home today.  Cardiology to arrange her outpatient follow up.     Assessment and Plan: * Acute ST elevation myocardial infarction (STEMI) of inferior wall (HCC) Presented with acute onset chest pain found to have new inferior ST elevation on EKG.  High sensitivity troponin peaked at 1509.  Previous heart catheterization in 2016 showed moderate LAD and RCA disease, prior STEMI in 2014. Left heart Cath showed three-vessel CAD with total occlusions of the distal LAD and proximal left circumflex as well as sequential 90% ostial RCA and 100% mid RCA lesions thrombotic occlusion of the mid RCA stent was the culprit for the patient's inferior STEMI -- DAPT - aspirin and prasugrel x at least 12 months -- Echocardiogram revealed normal LVEF -- Aggressive secondary prevention of CAD -- Continue on ezetimibe and rosuvastatin -- Continued on carvedilol 12.5 mg daily -- Cardiac rehab referral -- Cardiology to arrange follow up  Dyslipidemia Crestor and Zetia  Type 2 diabetes mellitus without complications (HCC) Resume home regimen  at d/c  Essential hypertension Continued on home regimen  NSVT (nonsustained ventricular tachycardia) (HCC) No further episodes on tele, isolated. Maintain K > 4, Mg >2         Consultants: Cardiology Procedures performed: Left heart cath  Disposition: Home Diet recommendation:  Discharge Diet Orders (From admission, onward)     Start     Ordered    04/10/23 0000  Diet - low sodium heart healthy        04/10/23 1139            DISCHARGE MEDICATION: Allergies as of 04/10/2023       Reactions   Lisinopril Swelling   Sulfa Antibiotics Itching        Medication List     TAKE these medications    aspirin EC 81 MG tablet Take by mouth.   carvedilol 6.25 MG tablet Commonly known as: COREG Take 1 tablet (6.25 mg total) by mouth 2 (two) times daily with a meal. What changed:  medication strength how much to take when to take this   cholecalciferol 10 MCG (400 UNIT) Tabs tablet Commonly known as: VITAMIN D3 Take 400 Units by mouth.   Coenzyme Q10 10 MG capsule Take by mouth.   cyanocobalamin 1000 MCG tablet Take by mouth.   ezetimibe 10 MG tablet Commonly known as: ZETIA Take 1 tablet (10 mg total) by mouth daily.   isosorbide mononitrate 30 MG 24 hr tablet Commonly known as: IMDUR Take 1 tablet (30 mg total) by mouth daily.   Multi-Vitamin tablet Take 1 tablet by mouth daily.   nitroGLYCERIN 0.4 MG SL tablet Commonly known as: NITROSTAT Place 1 tablet (0.4 mg total) under the tongue every 5 (five) minutes as needed for chest pain.   Omega-3 2100 1050 MG Caps Take 1 tablet by mouth daily.   oxybutynin 10 MG 24 hr tablet Commonly known as: DITROPAN-XL Take 10 mg by mouth at bedtime.   prasugrel 10 MG Tabs tablet Commonly known as: EFFIENT Take 1 tablet (10 mg total) by mouth daily.   valsartan 80 MG tablet Commonly known as: DIOVAN Take 1 tablet (80 mg total) by mouth daily.        Discharge Exam: Filed Weights   04/08/23 2216 04/09/23 0045  Weight: 117.9 kg 126.6 kg   General exam: awake, alert, no acute distress, obese HEENT: moist mucus membranes, hearing grossly normal  Respiratory system: CTA, no wheezes, rales or rhonchi, normal respiratory effort. Cardiovascular system: normal S1/S2, RRR, no JVD, murmurs, rubs, gallops, no pedal edema.   Gastrointestinal system: soft, NT, ND, no  HSM felt, +bowel sounds. Central nervous system: A&O x3. no gross focal neurologic deficits, normal speech Extremities: moves all, no edema, normal tone Skin: dry, intact, normal temperature, normal color, No rashes, lesions or ulcers Psychiatry: normal mood, congruent affect, judgement and insight appear normal   Condition at discharge: stable  The results of significant diagnostics from this hospitalization (including imaging, microbiology, ancillary and laboratory) are listed below for reference.   Imaging Studies: ECHOCARDIOGRAM COMPLETE  Result Date: 04/09/2023    ECHOCARDIOGRAM REPORT   Patient Name:   JONAE COBLE Date of Exam: 04/09/2023 Medical Rec #:  [id number]       Height:       64.0 in Accession #:    [id number]      Weight:       279.1 lb Date of Birth:  [birth year]       BSA:  2.254 m Patient Age:    61 years        BP:           128/71 mmHg Patient Gender: F               HR:           66 bpm. Exam Location:  [facility] Procedure: 2D Echo, Cardiac Doppler, Color Doppler, Strain Analysis and 3D Echo Indications:     Acute MI  History:         Patient has no prior history of Echocardiogram examinations.                  Acute MI, CAD and Previous Myocardial Infarction,                  Signs/Symptoms:Dyspnea; Risk Factors:Hypertension, Diabetes and                  Dyslipidemia.  Sonographer:     Mikki Harbor Referring Phys:  [phone] CHRISTOPHER END Diagnosing Phys: Yvonne Kendall MD  Sonographer Comments: Patient is obese. Global longitudinal strain was attempted. IMPRESSIONS  1. Left ventricular ejection fraction, by estimation, is 55 to 60%. The left ventricle has normal function. The left ventricle demonstrates regional wall motion abnormalities (see scoring diagram/findings for description). There is mild left ventricular  hypertrophy. Left ventricular diastolic parameters are consistent with Grade II diastolic dysfunction (pseudonormalization). Elevated left atrial pressure.  There is mild hypokinesis of the left ventricular, basal-mid inferolateral wall.  2. Right ventricular systolic function is normal. The right ventricular size is normal. There is normal pulmonary artery systolic pressure.  3. The mitral valve is abnormal. Mild mitral valve regurgitation. No evidence of mitral stenosis.  4. The aortic valve is tricuspid. Aortic valve regurgitation is not visualized. No aortic stenosis is present. FINDINGS  Left Ventricle: Left ventricular ejection fraction, by estimation, is 55 to 60%. The left ventricle has normal function. The left ventricle demonstrates regional wall motion abnormalities. Mild hypokinesis of the left ventricular, basal-mid inferolateral wall. Global longitudinal strain performed but not reported based on interpreter judgement due to suboptimal tracking. The left ventricular internal cavity size was normal in size. There is mild left ventricular hypertrophy. Left ventricular diastolic parameters are consistent with Grade II diastolic dysfunction (pseudonormalization). Elevated left atrial pressure. Right Ventricle: The right ventricular size is normal. No increase in right ventricular wall thickness. Right ventricular systolic function is normal. There is normal pulmonary artery systolic pressure. Left Atrium: Left atrial size was normal in size. Right Atrium: Right atrial size was normal in size. Pericardium: There is no evidence of pericardial effusion. Mitral Valve: The mitral valve is abnormal. There is moderate thickening of the mitral valve leaflet(s). Mild mitral valve regurgitation. No evidence of mitral valve stenosis. MV peak gradient, 8.0 mmHg. The mean mitral valve gradient is 3.0 mmHg. Tricuspid Valve: The tricuspid valve is normal in structure. Tricuspid valve regurgitation is mild. Aortic Valve: The aortic valve is tricuspid. Aortic valve regurgitation is not visualized. No aortic stenosis is present. Aortic valve mean gradient measures 5.0 mmHg.  Aortic valve peak gradient measures 9.7 mmHg. Aortic valve area, by VTI measures 2.57 cm. Pulmonic Valve: The pulmonic valve was not well visualized. Pulmonic valve regurgitation is trivial. No evidence of pulmonic stenosis. Aorta: The aortic root and ascending aorta are structurally normal, with no evidence of dilitation. Pulmonary Artery: The pulmonary artery is of normal size. Venous: The inferior vena cava was not well visualized.  IAS/Shunts: The interatrial septum was not well visualized.  LEFT VENTRICLE PLAX 2D LVIDd:         4.80 cm     Diastology LVIDs:         4.00 cm     LV e' medial:    6.20 cm/s LV PW:         1.00 cm     LV E/e' medial:  19.2 LV IVS:        1.30 cm     LV e' lateral:   8.05 cm/s LVOT diam:     1.90 cm     LV E/e' lateral: 14.8 LV SV:         81 LV SV Index:   36 LVOT Area:     2.84 cm  LV Volumes (MOD) LV vol d, MOD A2C: 82.7 ml LV vol d, MOD A4C: 82.2 ml LV vol s, MOD A2C: 33.0 ml LV vol s, MOD A4C: 40.5 ml LV SV MOD A2C:     49.7 ml LV SV MOD A4C:     82.2 ml LV SV MOD BP:      47.6 ml RIGHT VENTRICLE RV Basal diam:  3.50 cm RV Mid diam:    3.40 cm RV S prime:     11.20 cm/s TAPSE (M-mode): 2.6 cm LEFT ATRIUM             Index        RIGHT ATRIUM           Index LA diam:        4.20 cm 1.86 cm/m   RA Area:     19.00 cm LA Vol (A2C):   49.6 ml 22.00 ml/m  RA Volume:   58.30 ml  25.86 ml/m LA Vol (A4C):   50.8 ml 22.54 ml/m LA Biplane Vol: 52.0 ml 23.07 ml/m  AORTIC VALVE                     PULMONIC VALVE AV Area (Vmax):    2.42 cm      PV Vmax:       1.04 m/s AV Area (Vmean):   2.17 cm      PV Peak grad:  4.3 mmHg AV Area (VTI):     2.57 cm AV Vmax:           156.00 cm/s AV Vmean:          103.000 cm/s AV VTI:            0.316 m AV Peak Grad:      9.7 mmHg AV Mean Grad:      5.0 mmHg LVOT Vmax:         133.00 cm/s LVOT Vmean:        78.800 cm/s LVOT VTI:          0.286 m LVOT/AV VTI ratio: 0.91  AORTA Ao Root diam: 3.20 cm Ao Asc diam:  3.20 cm MITRAL VALVE                 TRICUSPID VALVE MV Area (PHT): 4.21 cm     TR Peak grad:   20.1 mmHg MV Area VTI:   2.06 cm     TR Vmax:        224.00 cm/s MV Peak grad:  8.0 mmHg MV Mean grad:  3.0 mmHg     SHUNTS MV Vmax:       1.41 m/s  Systemic VTI:  0.29 m MV Vmean:      72.3 cm/s    Systemic Diam: 1.90 cm MV Decel Time: 180 msec MV E velocity: 119.00 cm/s MV A velocity: 82.80 cm/s MV E/A ratio:  1.44 Cristal Deer End MD Electronically signed by Yvonne Kendall MD Signature Date/Time: 04/09/2023/1:29:38 PM    Final    CARDIAC CATHETERIZATION  Result Date: 04/09/2023 Conclusions: Severe three-vessel coronary artery disease, including chronic total occlusions of the distal LAD and proximal LCx as well as sequential 90% ostial RCA and 100% mid RCA lesions (thrombotic occlusion of the mid RCA stent is the culprit for the patient's inferior STEMI). Patent proximal RCA bare-metal stent with approximately 20% diffuse in-stent restenosis. Normal left ventricular systolic function (LVEF 55-65%) with mildly elevated filling pressure (LVEDP 15 mmHg). Successful PCI to thrombotic occlusion of mid RCA using Onyx Frontier 3.5 x 34 mm drug-eluting stent (covers entire old mid RCA bare-metal stent) with 0% residual stenosis and TIMI-3 flow. Successful PCI to ostial RCA stenosis using Onyx Frontier 4.0 x 15 mm drug-eluting stent with 0% residual stenosis and TIMI-3 flow. Recommendations: Dual antiplatelet therapy with aspirin and prasugrel for at least 12 months. Aggressive secondary prevention of coronary artery disease; will need to readdress high intensity statin therapy given reported history of statin intolerance in the past versus outpatient initiation of PCSK9 inhibitor therapy. Obtain echocardiogram. Medical management of chronic total occlusions of distal LAD and proximal LCx. Yvonne Kendall, MD [facility]   Microbiology: Results for orders placed or performed during the hospital encounter of 04/08/23  MRSA Next Gen by PCR, Nasal      Status: None   Collection Time: 04/09/23 12:38 AM   Specimen: Nasal Mucosa; Nasal Swab  Result Value Ref Range Status   MRSA by PCR Next Gen NOT DETECTED NOT DETECTED Final    Comment: (NOTE) The GeneXpert MRSA Assay (FDA approved for NASAL specimens only), is one component of a comprehensive MRSA colonization surveillance program. It is not intended to diagnose MRSA infection nor to guide or monitor treatment for MRSA infections. Test performance is not FDA approved in patients less than 3 years old. Performed at [facility], [address] [place], [place] [address]     Labs: CBC: No results for input(s): "WBC", "NEUTROABS", "HGB", "HCT", "MCV", "PLT" in the last 168 hours.  Basic Metabolic Panel: No results for input(s): "NA", "K", "CL", "CO2", "GLUCOSE", "BUN", "CREATININE", "CALCIUM", "MG", "PHOS" in the last 168 hours.  Liver Function Tests: No results for input(s): "AST", "ALT", "ALKPHOS", "BILITOT", "PROT", "ALBUMIN" in the last 168 hours.  CBG: No results for input(s): "GLUCAP" in the last 168 hours.   Discharge time spent: less than 30 minutes.  Signed: Pennie Banter, DO [facility] 04/19/2023

## 2023-04-10 NOTE — Progress Notes (Signed)
Rounding Note    Patient Name: Denise [name] Date of Encounter: 04/10/2023  [facility] Cardiologist: Julien Nordmann, MD   Subjective   Patient seen on a.m. rounds.  Denies any chest pain or shortness of breath.  Has been able to ambulate in the room without any complaints.  Inpatient Medications    Scheduled Meds:  aspirin EC  81 mg Oral Daily   carvedilol  12.5 mg Oral BID WC   enoxaparin (LOVENOX) injection  60 mg Subcutaneous Q24H   ezetimibe  10 mg Oral Daily   insulin aspart  0-15 Units Subcutaneous TID WC   insulin aspart  0-5 Units Subcutaneous QHS   irbesartan  75 mg Oral Daily   isosorbide mononitrate  30 mg Oral Daily   prasugrel  10 mg Oral Daily   rosuvastatin  20 mg Oral Daily   sodium chloride flush  3 mL Intravenous Q12H   Continuous Infusions:  sodium chloride     sodium chloride     PRN Meds: sodium chloride, acetaminophen **OR** acetaminophen, alum & mag hydroxide-simeth, magnesium hydroxide, morphine injection, nitroGLYCERIN, ondansetron **OR** ondansetron (ZOFRAN) IV, sodium chloride flush, traZODone   Vital Signs    Vitals:   04/09/23 1940 04/10/23 0036 04/10/23 0435 04/10/23 0747  BP: (!) 102/52 109/64 114/71 125/66  Pulse: 62 62 61 (!) 58  Resp: 20 18 18 18   Temp: (!) 97.5 F (36.4 C) 97.6 F (36.4 C) 97.9 F (36.6 C) 97.7 F (36.5 C)  TempSrc:      SpO2: 97% 98% 99% 98%  Weight:      Height:        Intake/Output Summary (Last 24 hours) at 04/10/2023 0938 Last data filed at 04/09/2023 1504 Gross per 24 hour  Intake 816.99 ml  Output 400 ml  Net 416.99 ml      04/09/2023   12:45 AM 04/08/2023   10:16 PM 10/16/2022    9:40 PM  Last 3 Weights  Weight (lbs) 279 lb 1.6 oz 260 lb 270 lb  Weight (kg) 126.6 kg 117.935 kg 122.471 kg      Telemetry    Sinus bradycardia sinus rhythm with PACs and PVCs with first-degree AV block rates of 50-60- Personally Reviewed  ECG    No new tracings- Personally Reviewed  Physical  Exam   GEN: No acute distress.   Neck: Unable to assess JVD due to body habitus Cardiac: RRR, no murmurs, rubs, or gallops.  Respiratory: Clear but mildly diminished to auscultation bilaterally.  Respirations are unlabored on room air GI: Soft, nontender, non-distended  MS: Trace pretibial edema; No deformity. Neuro:  Nonfocal  Psych: Normal affect   Labs    High Sensitivity Troponin:   Recent Labs  Lab 04/08/23 2208 04/09/23 0055  TROPONINIHS 392* 1,509*     Chemistry Recent Labs  Lab 04/08/23 2208 04/09/23 0344 04/10/23 0410  NA 136 136 137  K 3.9 3.8 4.0  CL 101 104 106  CO2 26 22 24   GLUCOSE 196* 143* 122*  BUN 25* 21 16  CREATININE 0.81 0.63 0.71  CALCIUM 9.7 9.1 8.8*  MG  --  2.2  --   PROT  --  6.8 6.4*  ALBUMIN  --  3.3* 3.1*  AST  --  65* 89*  ALT  --  45* 46*  ALKPHOS  --  75 66  BILITOT  --  0.8 0.8  GFRNONAA >60 >60 >60  ANIONGAP 9 10 7  Lipids  Recent Labs  Lab 04/09/23 0055  CHOL 188  TRIG 59  HDL 52  LDLCALC 124*  CHOLHDL 3.6    Hematology Recent Labs  Lab 04/08/23 2208 04/09/23 0344 04/10/23 0410  WBC 9.8 9.2 8.5  RBC 5.11 4.90 4.78  HGB 14.7 13.9 13.6  HCT 44.8 42.2 42.0  MCV 87.7 86.1 87.9  MCH 28.8 28.4 28.5  MCHC 32.8 32.9 32.4  RDW 12.7 12.7 12.8  PLT 197 190 178   Thyroid No results for input(s): "TSH", "FREET4" in the last 168 hours.  BNPNo results for input(s): "BNP", "PROBNP" in the last 168 hours.  DDimer No results for input(s): "DDIMER" in the last 168 hours.   Radiology      Cardiac Studies  TTE 04/09/23 1. Left ventricular ejection fraction, by estimation, is 55 to 60%. The  left ventricle has normal function. The left ventricle demonstrates  regional wall motion abnormalities (see scoring diagram/findings for  description). There is mild left ventricular   hypertrophy. Left ventricular diastolic parameters are consistent with  Grade II diastolic dysfunction (pseudonormalization). Elevated left atrial   pressure. There is mild hypokinesis of the left ventricular, basal-mid  inferolateral wall.   2. Right ventricular systolic function is normal. The right ventricular  size is normal. There is normal pulmonary artery systolic pressure.   3. The mitral valve is abnormal. Mild mitral valve regurgitation. No  evidence of mitral stenosis.   4. The aortic valve is tricuspid. Aortic valve regurgitation is not  visualized. No aortic stenosis is present.   LHC 04/08/23 Conclusions: Severe three-vessel coronary artery disease, including chronic total occlusions of the distal LAD and proximal LCx as well as sequential 90% ostial RCA and 100% mid RCA lesions (thrombotic occlusion of the mid RCA stent is the culprit for the patient's inferior STEMI). Patent proximal RCA bare-metal stent with approximately 20% diffuse in-stent restenosis. Normal left ventricular systolic function (LVEF 55-65%) with mildly elevated filling pressure (LVEDP 15 mmHg). Successful PCI to thrombotic occlusion of mid RCA using Onyx Frontier 3.5 x 34 mm drug-eluting stent (covers entire old mid RCA bare-metal stent) with 0% residual stenosis and TIMI-3 flow. Successful PCI to ostial RCA stenosis using Onyx Frontier 4.0 x 15 mm drug-eluting stent with 0% residual stenosis and TIMI-3 flow.   Recommendations: Dual antiplatelet therapy with aspirin and prasugrel for at least 12 months. Aggressive secondary prevention of coronary artery disease; will need to readdress high intensity statin therapy given reported history of statin intolerance in the past versus outpatient initiation of PCSK9 inhibitor therapy. Obtain echocardiogram. Medical management of chronic total occlusions of distal LAD and proximal LCx.  Patient Profile     62 y.o. female with a past medical history of coronary artery disease with remote bare-metal stents to the RCA and known coronary chronic total occlusion of the left circumflex, hypertension, hyperlipidemia,  borderline diabetes, has been seen evaluated for chest pain and abnormal EKG.  Assessment & Plan    Inferior STEMI with known history of CAD -Presented with acute onset chest pain found to have new inferior ST elevation on EKG. -High sensitivity troponin peaked at 1509 -Previous heart catheterization in 2016 showed moderate LAD and RCA disease, prior STEMI in 2014 -Cath revealed three-vessel coronary artery disease including total occlusions of the distal LAD and proximal left circumflex as well as sequential 90% ostial RCA and 100% mid RCA lesions thrombotic occlusion of the mid RCA stent was the culprit for the patient's inferior STEMI -Continued on  DAPT with aspirin and prasugrel for minimum of 12 months -Echocardiogram revealed normal LVEF -Aggressive secondary prevention of coronary artery disease -Continues to remain chest pain-free on exam -No further episodes of nonsustained V. tach noted on the monitor -Continue on ezetimibe and rosuvastatin -Continued on carvedilol 12.5 mg daily -Cardiac rehab referral -EKG as needed for pain or changes  Hypertension -Blood pressure 125/66 -Continued on current medication regimen -Vital signs per unit protocol  Hyperlipidemia -LDL 124 -LP(a) pending -Continued on ezetimibe 10 mg daily and rosuvastatin -Will need follow-up lipid panel and LFTs in 8 to 10 weeks  Diabetes -Continued on insulin -Management per IM     For questions or updates, please contact [facility] Please consult [email or web address] for contact info under        Signed, Dhwani Venkatesh, NP  04/10/2023, 9:38 AM

## 2023-04-11 ENCOUNTER — Telehealth: Payer: Self-pay | Admitting: Cardiology

## 2023-04-11 NOTE — Telephone Encounter (Signed)
   Transition of Care Follow-up Phone Call Request    Patient Name: Denise [name] Date of Birth: 1961-01-21 Date of Encounter: 04/11/2023  Primary Care Provider:  Patrice Paradise, MD Primary Cardiologist:  Julien Nordmann, MD  Park Liter needs to be scheduled for a transition of care follow up appointment with a [facility] provider in 1 week:  Patient was recently hospitalized for STEMI. Will need EKG on return. Can see me or Dr End.   Please reach out to [facility] within 48 hours to confirm appointment and review transition of care protocol questionnaire.  Rahima Fleishman, NP  04/11/2023, 4:42 PM

## 2023-04-12 NOTE — Telephone Encounter (Signed)
Patient contacted regarding discharge from [facility] on 04/10/23.  Patient understands to follow up with provider Eula Listen, PA on 04/19/23 at 11:20 am at [facility]. Patient understands discharge instructions? yes Patient understands medications and regiment? yes Patient understands to bring all medications to this visit? yes

## 2023-04-17 NOTE — Progress Notes (Unsigned)
Cardiology Office Note    Date:  04/19/2023   ID:  Denise [name], DOB 06-14-1961, MRN [id number]  PCP:  Patrice Paradise, MD  Cardiologist:  Julien Nordmann, MD  Electrophysiologist:  None   Chief Complaint: Hospital follow-up  History of Present Illness:   Denise [name] is a 62 y.o. female with history of CAD with inferior ST elevation MI in 2014 s/p BMS stents to the RCA and known CTO of the LCx with recent inferior ST elevation MI in 03/2023 status post PCI/DES to the RCA x 2 as outlined below, borderline diabetes, HTN, and HLD with statin intolerance who presents for hospital follow-up as outlined below.  She was previously followed by [facility] cardiology, transitioning her care to Dr. Mariah Milling in 2022.  She was admitted in 08/2012 with an inferior STEMI and underwent PCI/BMS x2 to the mid RCA and RPAV.  She was last seen by Dr. Mariah Milling in 07/2022 and was without symptoms of angina or cardiac decompensation.  She has previously declined statin and did not want PCSK9 inhibitor.  She remains on ezetimibe.  She was admitted to [facility] from 8/12 through 04/10/2023 with an inferior ST elevation MI.  High-sensitivity troponin trended to 1509.  LHC on 04/08/2023 showed severe three-vessel CAD including chronic total occlusions of the distal LAD and proximal LCx as well as sequential 90% ostial RCA and 100% mid RCA lesions (thrombotic occlusion of the mid RCA stent was the culprit for the patient's inferior ST elevation MI).  Patent proximal RCA BMS with approximately 20% diffuse in-stent restenosis.  Normal LV systolic function with an LVEF of 55 to 65% with mildly elevated filling pressures.  She underwent successful PCI to thrombotic occlusion of mid RCA (covered entire old mid RCA BMS) as well as successful PCI to ostial RCA stenosis.  Echo during the admission showed an EF of 55 to 60%, grade 2 diastolic dysfunction, mild hypokinesis of the basal mid inferolateral wall, normal RV systolic function,  ventricular cavity size, and RVSP, and mild mitral regurgitation.  She comes in today accompanied by her husband is doing well from a cardiac perspective, without symptoms of angina or cardiac decompensation.  No dizziness, presyncope, or syncope.  No falls or symptoms concerning for bleeding since hospital discharge.  Adherent to DAPT without missing doses.  She does note some bruising in the setting of leaning over a bed rail for her mother along her left lower quadrant of the abdomen.  No hematochezia or melena.  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  She wonders if this is exacerbated by Crestor use.  Recent [facility] screening showed normal ABIs bilaterally, no evidence of hemodynamically significant carotid artery stenosis, and no evidence of AAA.   Labs independently reviewed: 03/2023 - potassium 4.0, BUN 16, serum creatinine 0.71, albumin 3.1, AST 89, ALT 46, Hgb 13.6, PLT 178 magnesium 2.2, A1c 7.3, LP(a) 8.4, TC 188, TG 59, HDL 52, LDL 124 11/2021 - TSH normal  Past Medical History:  Diagnosis Date   CAD (coronary artery disease) 09/09/2012   Formatting of this note might be different from the original. A. 08/2012 STEMI. Cath: nl left main, L Cx with prox 70% stenosis, diffuse 60-80% stenosis in mid segment, LAD with 90% in the apical LAD, 60% ostial RCA, and 100% thrombotic occlusion to the distal RCA that was suspected to be culprit lesion. Cath team placed 2 x BMS to distal RCA lesion. B. 08/2012 echo: preserved LV function with infer  DOE (dyspnea on exertion) 10/07/2019   Essential hypertension 09/09/2012   Mixed hyperlipidemia 09/09/2012   Formatting of this note might be different from the original. 03/2019: TC 171 LDL 94 HDL 55 TG 109   Obesity, morbid (HCC) 04/08/2018   Statin intolerance 10/07/2019   Type 2 diabetes mellitus (HCC) 04/09/2018    Past Surgical History:  Procedure Laterality Date   BREAST EXCISIONAL BIOPSY Right 2010   Benign   CORONARY/GRAFT ACUTE MI  REVASCULARIZATION N/A 04/08/2023   Procedure: Coronary/Graft Acute MI Revascularization;  Surgeon: Yvonne Kendall, MD;  Location: [facility] INVASIVE CV LAB;  Service: Cardiovascular;  Laterality: N/A;   LEFT HEART CATH AND CORONARY ANGIOGRAPHY N/A 04/08/2023   Procedure: LEFT HEART CATH AND CORONARY ANGIOGRAPHY;  Surgeon: Yvonne Kendall, MD;  Location: [facility] INVASIVE CV LAB;  Service: Cardiovascular;  Laterality: N/A;    Current Medications: Current Meds  Medication Sig   aspirin 81 MG EC tablet Take by mouth.   Bempedoic Acid (NEXLETOL) 180 MG TABS Take 1 tablet (180 mg total) by mouth daily.   carvedilol (COREG) 6.25 MG tablet Take 1 tablet (6.25 mg total) by mouth 2 (two) times daily with a meal.   cholecalciferol (VITAMIN D3) 10 MCG (400 UNIT) TABS tablet Take 400 Units by mouth.   Coenzyme Q10 10 MG capsule Take by mouth.   cyanocobalamin 1000 MCG tablet Take by mouth.   ezetimibe (ZETIA) 10 MG tablet Take 1 tablet (10 mg total) by mouth daily.   isosorbide mononitrate (IMDUR) 30 MG 24 hr tablet Take 1 tablet (30 mg total) by mouth daily.   Multiple Vitamin (MULTI-VITAMIN) tablet Take 1 tablet by mouth daily.   nitroGLYCERIN (NITROSTAT) 0.4 MG SL tablet Place 1 tablet (0.4 mg total) under the tongue every 5 (five) minutes as needed for chest pain.   NON FORMULARY Nitric Oxide Powder daily.   NON FORMULARY GreensBerry Takes powder daily.   Omega-3 Fatty Acids (OMEGA-3 2100) 1050 MG CAPS Take 1 tablet by mouth daily.   oxybutynin (DITROPAN-XL) 10 MG 24 hr tablet Take 10 mg by mouth at bedtime.   prasugrel (EFFIENT) 10 MG TABS tablet Take 1 tablet (10 mg total) by mouth daily.   valsartan (DIOVAN) 80 MG tablet Take 1 tablet (80 mg total) by mouth daily.   [DISCONTINUED] rosuvastatin (CRESTOR) 20 MG tablet Take 1 tablet (20 mg total) by mouth daily.    Allergies:   Lisinopril and Sulfa antibiotics   Social History   Socioeconomic History   Marital status: Married    Spouse name: Not on  file   Number of children: Not on file   Years of education: Not on file   Highest education level: Not on file  Occupational History   Not on file  Tobacco Use   Smoking status: Never   Smokeless tobacco: Never  Vaping Use   Vaping status: Never Used  Substance and Sexual Activity   Alcohol use: Yes    Alcohol/week: 1.0 standard drink of alcohol    Types: 1 Glasses of wine per week    Comment: states once or twice per year   Drug use: Never   Sexual activity: Not on file  Other Topics Concern   Not on file  Social History Narrative   Not on file   Social Determinants of Health   Financial Resource Strain: Not on file  Food Insecurity: Not on file  Transportation Needs: Not on file  Physical Activity: Not on file  Stress: Not on  file  Social Connections: Not on file     Family History:  The patient's family history includes Breast cancer in her maternal aunt; Dementia in her mother; Diabetes in her mother; Heart failure in her father; Thyroid disease in her mother.  ROS:   12-point review of systems is negative unless otherwise noted in the HPI.   EKGs/Labs/Other Studies Reviewed:    Studies reviewed were summarized above. The additional studies were reviewed today:  2D echo 04/09/2023: 1. Left ventricular ejection fraction, by estimation, is 55 to 60%. The  left ventricle has normal function. The left ventricle demonstrates  regional wall motion abnormalities (see scoring diagram/findings for  description). There is mild left ventricular   hypertrophy. Left ventricular diastolic parameters are consistent with  Grade II diastolic dysfunction (pseudonormalization). Elevated left atrial  pressure. There is mild hypokinesis of the left ventricular, basal-mid  inferolateral wall.   2. Right ventricular systolic function is normal. The right ventricular  size is normal. There is normal pulmonary artery systolic pressure.   3. The mitral valve is abnormal. Mild mitral  valve regurgitation. No  evidence of mitral stenosis.   4. The aortic valve is tricuspid. Aortic valve regurgitation is not  visualized. No aortic stenosis is present.  __________  [facility] 04/08/2023: Conclusions: Severe three-vessel coronary artery disease, including chronic total occlusions of the distal LAD and proximal LCx as well as sequential 90% ostial RCA and 100% mid RCA lesions (thrombotic occlusion of the mid RCA stent is the culprit for the patient's inferior STEMI). Patent proximal RCA bare-metal stent with approximately 20% diffuse in-stent restenosis. Normal left ventricular systolic function (LVEF 55-65%) with mildly elevated filling pressure (LVEDP 15 mmHg). Successful PCI to thrombotic occlusion of mid RCA using Onyx Frontier 3.5 x 34 mm drug-eluting stent (covers entire old mid RCA bare-metal stent) with 0% residual stenosis and TIMI-3 flow. Successful PCI to ostial RCA stenosis using Onyx Frontier 4.0 x 15 mm drug-eluting stent with 0% residual stenosis and TIMI-3 flow.   Recommendations: Dual antiplatelet therapy with aspirin and prasugrel for at least 12 months. Aggressive secondary prevention of coronary artery disease; will need to readdress high intensity statin therapy given reported history of statin intolerance in the past versus outpatient initiation of PCSK9 inhibitor therapy. Obtain echocardiogram. Medical management of chronic total occlusions of distal LAD and proximal LCx.   EKG:  EKG is ordered today.  The EKG ordered today demonstrates NSR, 67 bpm, 1st degree AV block, low voltage QRS, prior inferior infarct, no acute st/t changes  Recent Labs: 04/09/2023: Magnesium 2.2 04/10/2023: ALT 46; BUN 16; Creatinine, Ser 0.71; Hemoglobin 13.6; Platelets 178; Potassium 4.0; Sodium 137  Recent Lipid Panel    Component Value Date/Time   CHOL 188 04/09/2023 0055   TRIG 59 04/09/2023 0055   HDL 52 04/09/2023 0055   CHOLHDL 3.6 04/09/2023 0055   VLDL 12 04/09/2023 0055    LDLCALC 124 (H) 04/09/2023 0055    PHYSICAL EXAM:    VS:  BP 110/72 (BP Location: Left Arm, Patient Position: Sitting, Cuff Size: Large)   Pulse 67   Ht 5\' 4"  (1.626 m)   Wt 261 lb (118.4 kg)   SpO2 97%   BMI 44.80 kg/m   BMI: Body mass index is 44.8 kg/m.  Physical Exam Vitals reviewed.  Constitutional:      Appearance: She is well-developed.  HENT:     Head: Normocephalic and atraumatic.  Eyes:     General:  Right eye: No discharge.        Left eye: No discharge.  Neck:     Vascular: No JVD.  Cardiovascular:     Rate and Rhythm: Normal rate and regular rhythm.     Pulses:          Posterior tibial pulses are 2+ on the right side and 2+ on the left side.     Heart sounds: Normal heart sounds, S1 normal and S2 normal. Heart sounds not distant. No midsystolic click and no opening snap. No murmur heard.    No friction rub.     Comments: Radial arteriotomy site without active bleeding, bruising, swelling, warmth, erythema, or tenderness to palpation.  Radial pulse 2+ proximal and distal to the arteriotomy site. Pulmonary:     Effort: Pulmonary effort is normal. No respiratory distress.     Breath sounds: Normal breath sounds. No decreased breath sounds, wheezing or rales.  Chest:     Chest wall: No tenderness.  Abdominal:     General: There is no distension.     Comments: Bruise noted along the left lower quadrant.  Musculoskeletal:     Cervical back: Normal range of motion.     Right lower leg: No edema.     Left lower leg: No edema.  Skin:    General: Skin is warm and dry.     Nails: There is no clubbing.  Neurological:     Mental Status: She is alert and oriented to person, place, and time.  Psychiatric:        Speech: Speech normal.        Behavior: Behavior normal.        Thought Content: Thought content normal.        Judgment: Judgment normal.     Wt Readings from Last 3 Encounters:  04/19/23 261 lb (118.4 kg)  04/09/23 279 lb 1.6 oz (126.6 kg)   10/16/22 270 lb (122.5 kg)     ASSESSMENT & PLAN:   CAD involving the native coronary arteries with inferior STEMI without angina: She is doing well and without symptoms concerning for angina or cardiac decompensation.  Continue DAPT with ASA 81 mg daily and Effient 10 mg daily without interruption for a minimum of 12 months, possibly longer given overlapping stents within the RCA.  If bruising becomes more problematic, may need to consider transitioning to clopidogrel.  Check CBC.  She is cleared to participate with cardiac rehab.  No indication for further ischemic testing at this time.  HTN: Blood pressure is well-controlled in the office today.  She remains on carvedilol, valsartan, and Imdur.  HLD with statin intolerance: LDL 124 in 03/2023 with target LDL being less than 55.  Notes an increase in neuropathy on rechallenge with rosuvastatin.  Discontinue.  She feels like she will be unable to afford PCSK9 inhibitor as her husband has a prescription for this as well and they are awaiting to hear back from the patient assistance.  Trial of bempedoic acid with continuation of ezetimibe.  May need to consider inclisiran.  Repeat fasting lipid panel and LFT in follow-up.   Disposition: F/u with Dr. Mariah Milling or an APP in 3 months.   Medication Adjustments/Labs and Tests Ordered: Current medicines are reviewed at length with the patient today.  Concerns regarding medicines are outlined above. Medication changes, Labs and Tests ordered today are summarized above and listed in the Patient Instructions accessible in Encounters.   Elinor Dodge, PA-C 04/19/2023 12:40  PM     [facility] [address] [place], [place] [address] [phone]

## 2023-04-19 ENCOUNTER — Ambulatory Visit: Payer: Self-pay | Attending: Physician Assistant | Admitting: Physician Assistant

## 2023-04-19 ENCOUNTER — Encounter: Payer: Self-pay | Admitting: Physician Assistant

## 2023-04-19 VITALS — BP 110/72 | HR 67 | Ht 64.0 in | Wt 261.0 lb

## 2023-04-19 DIAGNOSIS — I2510 Atherosclerotic heart disease of native coronary artery without angina pectoris: Secondary | ICD-10-CM

## 2023-04-19 DIAGNOSIS — I10 Essential (primary) hypertension: Secondary | ICD-10-CM

## 2023-04-19 DIAGNOSIS — E785 Hyperlipidemia, unspecified: Secondary | ICD-10-CM

## 2023-04-19 DIAGNOSIS — Z789 Other specified health status: Secondary | ICD-10-CM

## 2023-04-19 MED ORDER — NEXLETOL 180 MG PO TABS: 180.0000 mg | ORAL_TABLET | Freq: Every day | ORAL | 3 refills | Status: DC

## 2023-04-19 NOTE — Patient Instructions (Signed)
Medication Instructions:  Your physician recommends the following medication changes.  STOP TAKING: Crestor  START TAKING: Nexletol 180 mg daily *If you need a refill on your cardiac medications before your next appointment, please call your pharmacy*   Lab Work: Your provider would like for you to have following labs drawn today CBC.   If you have labs (blood work) drawn today and your tests are completely normal, you will receive your results only by: MyChart Message (if you have MyChart) OR A paper copy in the mail If you have any lab test that is abnormal or we need to change your treatment, we will call you to review the results.   Testing/Procedures: None   Follow-Up: At [facility], you and your health needs are our priority.  As part of our continuing mission to provide you with exceptional heart care, we have created designated Provider Care Teams.  These Care Teams include your primary Cardiologist (physician) and Advanced Practice Providers (APPs -  Physician Assistants and Nurse Practitioners) who all work together to provide you with the care you need, when you need it.  We recommend signing up for the patient portal called "MyChart".  Sign up information is provided on this After Visit Summary.  MyChart is used to connect with patients for Virtual Visits (Telemedicine).  Patients are able to view lab/test results, encounter notes, upcoming appointments, etc.  Non-urgent messages can be sent to your provider as well.   To learn more about what you can do with MyChart, go to [email or web address].    Your next appointment:   3 month(s)  Provider:   You may see Julien Nordmann, MD or one of the following Advanced Practice Providers on your designated Care Team:   Nicolasa Ducking, NP Eula Listen, PA-C Cadence Fransico Michael, PA-C Charlsie Quest, NP

## 2023-04-19 NOTE — Assessment & Plan Note (Signed)
No further episodes on tele, isolated. Maintain K > 4, Mg >2

## 2023-04-20 LAB — CBC
Hematocrit: 41.9 % (ref 34.0–46.6)
Hemoglobin: 14.1 g/dL (ref 11.1–15.9)
MCH: 29.6 pg (ref 26.6–33.0)
MCHC: 33.7 g/dL (ref 31.5–35.7)
MCV: 88 fL (ref 79–97)
Platelets: 222 10*3/uL (ref 150–450)
RBC: 4.77 x10E6/uL (ref 3.77–5.28)
RDW: 12.5 % (ref 11.7–15.4)
WBC: 8.2 10*3/uL (ref 3.4–10.8)

## 2023-06-07 ENCOUNTER — Other Ambulatory Visit: Payer: Self-pay

## 2023-06-07 MED ORDER — PRASUGREL HCL 10 MG PO TABS: 10.0000 mg | ORAL_TABLET | Freq: Every day | ORAL | 3 refills | Status: DC

## 2023-06-07 MED ORDER — CARVEDILOL 6.25 MG PO TABS: 6.2500 mg | ORAL_TABLET | Freq: Two times a day (BID) | ORAL | 3 refills | Status: DC

## 2023-07-24 ENCOUNTER — Ambulatory Visit: Payer: Self-pay | Admitting: Cardiovascular Disease

## 2023-08-06 ENCOUNTER — Telehealth: Payer: Self-pay | Admitting: Cardiovascular Disease

## 2023-08-06 NOTE — Telephone Encounter (Signed)
Pt c/o medication issue:  1. Name of Medication:   prasugrel (EFFIENT) 10 MG TABS tablet    2. How are you currently taking this medication (dosage and times per day)? As written   3. Are you having a reaction (difficulty breathing--STAT)? No   4. What is your medication issue? Pt called in stating she needs a refill but she has been bruising easily and she isn't sure if she should stay on this medication. Please advise.

## 2023-08-09 NOTE — Telephone Encounter (Signed)
Pt describes mild bruising from contact (nuisance).  Pt assured that med was necessary at full strength.  Pt given opportunity to change med (she's at initial refill point) pt declines changes.  Pt acknowledges that she will continue med as prescribed and contact us the bruising get to the point of needing to change to another type.

## 2023-08-31 ENCOUNTER — Other Ambulatory Visit: Payer: Self-pay | Admitting: Cardiovascular Disease

## 2023-08-31 NOTE — Telephone Encounter (Signed)
 Appointment scheduled for 09/26/23

## 2023-08-31 NOTE — Telephone Encounter (Signed)
 Good Morning,  Could you schedule this patient an overdue 3 month follow up visit? The patient was last seen on 04-18-2022 by R. Dunn. Thank you so much.

## 2023-09-25 NOTE — Progress Notes (Unsigned)
Cardiology Office Note    Date:  09/26/2023   ID:  Denise [name], DOB Mar 16, 1961, MRN [id number]  PCP:  Patrice Paradise, MD  Cardiologist:  Julien Nordmann, MD  Electrophysiologist:  None   Chief Complaint: Follow up  History of Present Illness:   Denise [name] is a 63 y.o. female with history of CAD with inferior ST elevation MI in 2014 s/p BMS stents to the RCA and known CTO of the LCx with recent inferior ST elevation MI in 03/2023 status post PCI/DES to the RCA x 2 as outlined below, borderline diabetes, HTN, and HLD with statin intolerance who presents for follow up of CAD.   Denise [name] was previously followed by [facility] cardiology, transitioning her care to Dr. Mariah Milling in 2022.  Denise [name] was admitted in 08/2012 with an inferior STEMI and underwent PCI/BMS x2 to the mid RCA and RPAV.  Denise [name] was admitted to [facility] from 8/12 through 04/10/2023 with an inferior ST elevation MI.  High-sensitivity troponin trended to 1509.  LHC on 04/08/2023 showed severe three-vessel CAD including chronic total occlusions of the distal LAD and proximal LCx as well as sequential 90% ostial RCA and 100% mid RCA lesions (thrombotic occlusion of the mid RCA stent was the culprit for the patient's inferior ST elevation MI).  Patent proximal RCA BMS with approximately 20% diffuse in-stent restenosis.  Normal LV systolic function with an LVEF of 55 to 65% with mildly elevated filling pressures.  Denise [name] underwent successful PCI to thrombotic occlusion of mid RCA (covered entire old mid RCA BMS) as well as successful PCI to ostial RCA stenosis.  Echo during the admission showed an EF of 55 to 60%, grade 2 diastolic dysfunction, mild hypokinesis of the basal mid inferolateral wall, normal RV systolic function, ventricular cavity size, and RVSP, and mild mitral regurgitation.  Notes indicate Denise [name] has previously declined statin and did not want PCSK9 inhibitor.    Denise [name] was seen in hospital follow-up on 04/20/2023 and was without symptoms of  angina or cardiac decompensation.  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  In this setting, we underwent a trial of bempedoic acid with continuation of ezetimibe.  Denise [name] comes in doing well from a cardiac perspective and is without symptoms of angina or cardiac decompensation.  No dizziness, presyncope, or syncope.  Mild intermittent lower extremity swelling remains stable.  No progressive orthopnea.  No falls, hematochezia, or melena.  Nuisance bruising is improving.  Remains adherent to cardiac medications, including DAPT.  Under increased stress surrounding the health of her daughter.  Was not able to pick up bempedoic acid.  Overall feels well from a cardiac perspective and does not have any acute cardiac concerns at this time.   Labs independently reviewed: 05/2023 - Hgb 15.1, PLT 215, potassium 4.7, BUN 21, serum creatinine 0.7, albumin 3.8, AST/ALT normal, TC 170, TG 104, HDL 48, LDL 108, A1c 7.3 11/2021 - TSH normal   Past Medical History:  Diagnosis Date   CAD (coronary artery disease) 09/09/2012   Formatting of this note might be different from the original. A. 08/2012 STEMI. Cath: nl left main, L Cx with prox 70% stenosis, diffuse 60-80% stenosis in mid segment, LAD with 90% in the apical LAD, 60% ostial RCA, and 100% thrombotic occlusion to the distal RCA that was suspected to be culprit lesion. Cath team placed 2 x BMS to distal RCA lesion. B. 08/2012 echo: preserved LV function with infer   DOE (dyspnea on  exertion) 10/07/2019   Essential hypertension 09/09/2012   Mixed hyperlipidemia 09/09/2012   Formatting of this note might be different from the original. 03/2019: TC 171 LDL 94 HDL 55 TG 109   Obesity, morbid (HCC) 04/08/2018   Statin intolerance 10/07/2019   Type 2 diabetes mellitus (HCC) 04/09/2018    Past Surgical History:  Procedure Laterality Date   BREAST EXCISIONAL BIOPSY Right 2010   Benign   CORONARY/GRAFT ACUTE MI REVASCULARIZATION N/A 04/08/2023    Procedure: Coronary/Graft Acute MI Revascularization;  Surgeon: Yvonne Kendall, MD;  Location: [facility] INVASIVE CV LAB;  Service: Cardiovascular;  Laterality: N/A;   LEFT HEART CATH AND CORONARY ANGIOGRAPHY N/A 04/08/2023   Procedure: LEFT HEART CATH AND CORONARY ANGIOGRAPHY;  Surgeon: Yvonne Kendall, MD;  Location: [facility] INVASIVE CV LAB;  Service: Cardiovascular;  Laterality: N/A;    Current Medications: Current Meds  Medication Sig   aspirin 81 MG EC tablet Take by mouth.   carvedilol (COREG) 6.25 MG tablet Take 1 tablet (6.25 mg total) by mouth 2 (two) times daily with a meal.   cholecalciferol (VITAMIN D3) 10 MCG (400 UNIT) TABS tablet Take 400 Units by mouth.   Coenzyme Q10 10 MG capsule Take by mouth.   cyanocobalamin 1000 MCG tablet Take by mouth.   ezetimibe (ZETIA) 10 MG tablet TAKE 1 TABLET BY MOUTH DAILY   isosorbide mononitrate (IMDUR) 30 MG 24 hr tablet Take 1 tablet (30 mg total) by mouth daily.   Multiple Vitamin (MULTI-VITAMIN) tablet Take 1 tablet by mouth daily.   nitroGLYCERIN (NITROSTAT) 0.4 MG SL tablet Place 1 tablet (0.4 mg total) under the tongue every 5 (five) minutes as needed for chest pain.   NON FORMULARY Nitric Oxide Powder daily.   NON FORMULARY GreensBerry Takes powder daily.   Omega-3 Fatty Acids (OMEGA-3 2100) 1050 MG CAPS Take 1 tablet by mouth daily.   oxybutynin (DITROPAN-XL) 10 MG 24 hr tablet Take 10 mg by mouth at bedtime.   prasugrel (EFFIENT) 10 MG TABS tablet Take 1 tablet (10 mg total) by mouth daily.   valsartan (DIOVAN) 80 MG tablet TAKE 1 TABLET BY MOUTH DAILY    Allergies:   Lisinopril and Sulfa antibiotics   Social History   Socioeconomic History   Marital status: Married    Spouse name: Not on file   Number of children: Not on file   Years of education: Not on file   Highest education level: Not on file  Occupational History   Not on file  Tobacco Use   Smoking status: Never   Smokeless tobacco: Never  Vaping Use   Vaping  status: Never Used  Substance and Sexual Activity   Alcohol use: Yes    Alcohol/week: 1.0 standard drink of alcohol    Types: 1 Glasses of wine per week    Comment: states once or twice per year   Drug use: Never   Sexual activity: Not on file  Other Topics Concern   Not on file  Social History Narrative   Not on file   Social Drivers of Health   Financial Resource Strain: Low Risk  (06/14/2023)   Received from [facility]   Overall Financial Resource Strain (CARDIA)    Difficulty of Paying Living Expenses: Not hard at all  Food Insecurity: No Food Insecurity (06/14/2023)   Received from [facility]   Hunger Vital Sign    Worried About Running Out of Food in the Last Year: Never true  Ran Out of Food in the Last Year: Never true  Transportation Needs: No Transportation Needs (06/14/2023)   Received from [facility] - Transportation    In the past 12 months, has lack of transportation kept you from medical appointments or from getting medications?: No    Lack of Transportation (Non-Medical): No  Physical Activity: Not on file  Stress: Not on file  Social Connections: Not on file     Family History:  The patient's family history includes Breast cancer in her maternal aunt; Dementia in her mother; Diabetes in her mother; Heart failure in her father; Thyroid disease in her mother.  ROS:   12-point review of systems is negative unless otherwise noted in the HPI.   EKGs/Labs/Other Studies Reviewed:    Studies reviewed were summarized above. The additional studies were reviewed today:  2D echo 04/09/2023: 1. Left ventricular ejection fraction, by estimation, is 55 to 60%. The  left ventricle has normal function. The left ventricle demonstrates  regional wall motion abnormalities (see scoring diagram/findings for  description). There is mild left ventricular   hypertrophy. Left ventricular diastolic parameters  are consistent with  Grade II diastolic dysfunction (pseudonormalization). Elevated left atrial  pressure. There is mild hypokinesis of the left ventricular, basal-mid  inferolateral wall.   2. Right ventricular systolic function is normal. The right ventricular  size is normal. There is normal pulmonary artery systolic pressure.   3. The mitral valve is abnormal. Mild mitral valve regurgitation. No  evidence of mitral stenosis.   4. The aortic valve is tricuspid. Aortic valve regurgitation is not  visualized. No aortic stenosis is present.  __________   [facility] 04/08/2023: Conclusions: Severe three-vessel coronary artery disease, including chronic total occlusions of the distal LAD and proximal LCx as well as sequential 90% ostial RCA and 100% mid RCA lesions (thrombotic occlusion of the mid RCA stent is the culprit for the patient's inferior STEMI). Patent proximal RCA bare-metal stent with approximately 20% diffuse in-stent restenosis. Normal left ventricular systolic function (LVEF 55-65%) with mildly elevated filling pressure (LVEDP 15 mmHg). Successful PCI to thrombotic occlusion of mid RCA using Onyx Frontier 3.5 x 34 mm drug-eluting stent (covers entire old mid RCA bare-metal stent) with 0% residual stenosis and TIMI-3 flow. Successful PCI to ostial RCA stenosis using Onyx Frontier 4.0 x 15 mm drug-eluting stent with 0% residual stenosis and TIMI-3 flow.   Recommendations: Dual antiplatelet therapy with aspirin and prasugrel for at least 12 months. Aggressive secondary prevention of coronary artery disease; will need to readdress high intensity statin therapy given reported history of statin intolerance in the past versus outpatient initiation of PCSK9 inhibitor therapy. Obtain echocardiogram. Medical management of chronic total occlusions of distal LAD and proximal LCx.   EKG:  EKG is ordered today.  The EKG ordered today demonstrates NSR with first-degree AV block, 78 bpm, left axis  deviation, poor R wave progression along the precordial leads, prior inferior infarct, consistent with prior tracing  Recent Labs: 04/09/2023: Magnesium 2.2 04/10/2023: ALT 46; BUN 16; Creatinine, Ser 0.71; Potassium 4.0; Sodium 137 04/19/2023: Hemoglobin 14.1; Platelets 222  Recent Lipid Panel    Component Value Date/Time   CHOL 188 04/09/2023 0055   TRIG 59 04/09/2023 0055   HDL 52 04/09/2023 0055   CHOLHDL 3.6 04/09/2023 0055   VLDL 12 04/09/2023 0055   LDLCALC 124 (H) 04/09/2023 0055    PHYSICAL EXAM:    VS:  BP (!) 140/84 (BP Location: Left  Arm, Patient Position: Sitting, Cuff Size: Normal)   Pulse 78   Ht 5\' 4"  (1.626 m)   Wt 253 lb 3.2 oz (114.9 kg)   SpO2 98%   BMI 43.46 kg/m   BMI: Body mass index is 43.46 kg/m.  Physical Exam Vitals reviewed.  Constitutional:      Appearance: Denise [name] is well-developed.  HENT:     Head: Normocephalic and atraumatic.  Eyes:     General:        Right eye: No discharge.        Left eye: No discharge.  Cardiovascular:     Rate and Rhythm: Normal rate and regular rhythm.     Heart sounds: Normal heart sounds, S1 normal and S2 normal. Heart sounds not distant. No midsystolic click and no opening snap. No murmur heard.    No friction rub.  Pulmonary:     Effort: Pulmonary effort is normal. No respiratory distress.     Breath sounds: Normal breath sounds. No decreased breath sounds, wheezing, rhonchi or rales.  Chest:     Chest wall: No tenderness.  Abdominal:     General: There is no distension.  Musculoskeletal:     Cervical back: Normal range of motion.     Comments: Trivial bilateral pretibial edema with mild hyperpigmentation.  Skin:    General: Skin is warm and dry.     Nails: There is no clubbing.  Neurological:     Mental Status: Denise [name] is alert and oriented to person, place, and time.  Psychiatric:        Speech: Speech normal.        Behavior: Behavior normal.        Thought Content: Thought content normal.         Judgment: Judgment normal.     Wt Readings from Last 3 Encounters:  09/26/23 253 lb 3.2 oz (114.9 kg)  04/19/23 261 lb (118.4 kg)  04/09/23 279 lb 1.6 oz (126.6 kg)     ASSESSMENT & PLAN:   CAD involving the native coronary arteries with inferior STEMI without angina: Denise [name] is doing well and without symptoms concerning for angina or cardiac decompensation.  Continue DAPT with aspirin 81 mg daily and Effient 10 mg daily without interruption for a minimum of 12 months (from date of PCI), possibly longer, given overlapping stents within the RCA.  No indication for further ischemic testing at this time.  HTN: Blood pressure is mildly elevated in the office today, though Denise [name] is under increased stress surrounding the health of her daughter.  Denise [name] remains on carvedilol, valsartan, and Imdur.  HLD with statin intolerance: LDL 124 in 03/2023 with target LDL less than 55.  Unable to obtain bempedoic acid for unclear reasons.  We will reach out to the pharmacy.  Remains on ezetimibe.  Has previously felt like Denise [name] would not be able to afford PCSK9 inhibitor, though this may need to be revisited pending outcome with bempedoic acid.    Disposition: F/u with Dr. Mariah Milling or an APP in 3 months.   Medication Adjustments/Labs and Tests Ordered: Current medicines are reviewed at length with the patient today.  Concerns regarding medicines are outlined above. Medication changes, Labs and Tests ordered today are summarized above and listed in the Patient Instructions accessible in Encounters.   Signed, Eula Listen, PA-C 09/26/2023 5:16 PM     [facility] [place] [address] [place], [place] [address] [phone]

## 2023-09-26 ENCOUNTER — Ambulatory Visit: Payer: No Typology Code available for payment source | Attending: Physician Assistant | Admitting: Physician Assistant

## 2023-09-26 ENCOUNTER — Encounter: Payer: Self-pay | Admitting: Physician Assistant

## 2023-09-26 VITALS — BP 140/84 | HR 78 | Ht 64.0 in | Wt 253.2 lb

## 2023-09-26 DIAGNOSIS — E785 Hyperlipidemia, unspecified: Secondary | ICD-10-CM

## 2023-09-26 DIAGNOSIS — I2510 Atherosclerotic heart disease of native coronary artery without angina pectoris: Secondary | ICD-10-CM | POA: Diagnosis not present

## 2023-09-26 DIAGNOSIS — I10 Essential (primary) hypertension: Secondary | ICD-10-CM | POA: Diagnosis not present

## 2023-09-26 DIAGNOSIS — Z789 Other specified health status: Secondary | ICD-10-CM

## 2023-09-26 NOTE — Patient Instructions (Signed)
Medication Instructions:  Your Physician recommend you continue on your current medication as directed.    *If you need a refill on your cardiac medications before your next appointment, please call your pharmacy*  We will follow-up with the Nexletol.   Lab Work: None ordered at this time    Follow-Up: At [facility], you and your health needs are our priority.  As part of our continuing mission to provide you with exceptional heart care, we have created designated Provider Care Teams.  These Care Teams include your primary Cardiologist (physician) and Advanced Practice Providers (APPs -  Physician Assistants and Nurse Practitioners) who all work together to provide you with the care you need, when you need it.  We recommend signing up for the patient portal called "MyChart".  Sign up information is provided on this After Visit Summary.  MyChart is used to connect with patients for Virtual Visits (Telemedicine).  Patients are able to view lab/test results, encounter notes, upcoming appointments, etc.  Non-urgent messages can be sent to your provider as well.   To learn more about what you can do with MyChart, go to [email or web address].    Your next appointment:   3 month(s)  Provider:   You may see Julien Nordmann, MD or one of the following Advanced Practice Providers on your designated Care Team:   Nicolasa Ducking, NP Eula Listen, PA-C Cadence Fransico Michael, PA-C Charlsie Quest, NP Carlos Levering, NP

## 2023-10-03 ENCOUNTER — Telehealth: Payer: Self-pay | Admitting: Emergency Medicine

## 2023-10-03 NOTE — Telephone Encounter (Signed)
-----   Message from Eleanor JONETTA Crews sent at 09/27/2023 12:19 PM EST ----- Regarding: RE: Pt needs Nexletol  Can you please see if patient would be willing to come in for apt with Pharmd. I think it would be much easier to discuss options in person ----- Message ----- From: Hannah Marcus KIDD, RN Sent: 09/26/2023   5:46 PM EST To: Marcus KIDD Hannah, RN; C[facility] Pharmd Subject: Pt needs Nexletol                               Hello,  Please look into this Nexletol  need.  Below is the note from provider - see office visit 1/29:  Bernardino Bring, PA-C  HLD with statin intolerance: LDL 124 in 03/2023 with target LDL less than 55.  Unable to obtain bempedoic acid  for unclear reasons.  We will reach out to the pharmacy.  Remains on ezetimibe .  Has previously felt like she would not be able to afford PCSK9 inhibitor, though this may need to be revisited pending outcome with bempedoic acid .  Thanks, Marcus

## 2023-10-03 NOTE — Telephone Encounter (Signed)
[facility] - pt also asked to MyChart if she is agreeable to meeting with pharmacist to discuss Nexletol prescription - if so route to "NORTHLINE SCHED" to arrange appt

## 2023-10-04 ENCOUNTER — Telehealth: Payer: Self-pay | Admitting: Physician Assistant

## 2023-10-04 NOTE — Telephone Encounter (Signed)
 Please see previous encounter

## 2023-10-04 NOTE — Telephone Encounter (Signed)
 Follow Up:      Patient is returning Noel's cll from yesterday.

## 2023-10-04 NOTE — Telephone Encounter (Signed)
 I spoke with the patient regarding the Pharm D's recommendation to schedule an appointment for further discussion. The patient is agreeable. I will forward this to the scheduling department to arrange a date and time.

## 2023-10-17 ENCOUNTER — Ambulatory Visit: Payer: No Typology Code available for payment source

## 2023-12-12 ENCOUNTER — Other Ambulatory Visit: Payer: Self-pay | Admitting: Cardiovascular Disease

## 2023-12-31 ENCOUNTER — Ambulatory Visit: Payer: No Typology Code available for payment source | Admitting: Physician Assistant

## 2024-01-07 ENCOUNTER — Ambulatory Visit: Payer: No Typology Code available for payment source | Attending: Physician Assistant | Admitting: Physician Assistant

## 2024-01-07 ENCOUNTER — Encounter: Payer: Self-pay | Admitting: Physician Assistant

## 2024-01-07 VITALS — BP 143/84 | HR 71 | Ht 64.0 in | Wt 259.2 lb

## 2024-01-07 DIAGNOSIS — E785 Hyperlipidemia, unspecified: Secondary | ICD-10-CM

## 2024-01-07 DIAGNOSIS — I10 Essential (primary) hypertension: Secondary | ICD-10-CM | POA: Diagnosis not present

## 2024-01-07 DIAGNOSIS — I2510 Atherosclerotic heart disease of native coronary artery without angina pectoris: Secondary | ICD-10-CM

## 2024-01-07 DIAGNOSIS — Z789 Other specified health status: Secondary | ICD-10-CM

## 2024-01-07 MED ORDER — REPATHA SURECLICK 140 MG/ML ~~LOC~~ SOAJ: 140.0000 mg | SUBCUTANEOUS | 3 refills | Status: DC

## 2024-01-07 NOTE — Progress Notes (Signed)
 Cardiology Office Note    Date:  01/07/2024   ID:  Denise [name], DOB [birth year], MRN [id number]  PCP:  Delmus Ferri, MD  Cardiologist:  Belva Boyden, MD  Electrophysiologist:  None   Chief Complaint: Follow up  History of Present Illness:   Denise [name] is a 63 y.o. [name] with history of CAD with inferior ST elevation MI in 2014 s/p BMS stents to the RCA and known CTO of the LCx with recent inferior ST elevation MI in 03/2023 status post PCI/DES to the RCA x 2 as outlined below, borderline diabetes, HTN, and HLD with statin intolerance who presents for follow up of CAD.   She was previously followed by [facility] cardiology, transitioning her care to Dr. Gollan in 2022.  She was admitted in 08/2012 with an inferior STEMI and underwent PCI/BMS x2 to the mid RCA and RPAV.  She was admitted to [facility] in 03/2023 with an inferior ST elevation MI.  High-sensitivity troponin trended to 1509.  LHC on 04/08/2023 showed severe three-vessel CAD including chronic total occlusions of the distal LAD and proximal LCx as well as sequential 90% ostial RCA and 100% mid RCA lesions (thrombotic occlusion of the mid RCA stent was the culprit for the patient's inferior ST elevation MI).  Patent proximal RCA BMS with approximately 20% diffuse in-stent restenosis.  Normal LV systolic function with an LVEF of 55 to 65% with mildly elevated filling pressures.  She underwent successful PCI to thrombotic occlusion of mid RCA (covered entire old mid RCA BMS) as well as successful PCI to ostial RCA stenosis.  Echo during the admission showed an EF of 55 to 60%, grade 2 diastolic dysfunction, mild hypokinesis of the basal mid inferolateral wall, normal RV systolic function, ventricular cavity size, and RVSP, and mild mitral regurgitation.   Notes indicate she has previously declined statin and did not want PCSK9 inhibitor.     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  In this setting, we recommended a trial of bempedoic acid  with continuation of ezetimibe .  She was last seen in the office in 08/2023 and reported she was not able to pick up bempedoic acid .  Mild intermittent lower extremity swelling remained stable.  She comes in doing well from a cardiac perspective and is without symptoms of angina or cardiac decompensation.  No dizziness, presyncope, or syncope.  No falls, hematochezia, or melena.  Adherent and tolerating DAPT without off target effect.  Unable to obtain bempedoic acid  secondary to financial constraints versus insurance approval.  Would be interested in pursuing PCSK9 inhibitor at this time.  Under increased stress surrounding the health of her mother, who is on hospice care.   Labs independently reviewed: 05/2023 - Hgb 15.1, PLT 215, potassium 4.7, BUN 21, serum creatinine 0.7, albumin 3.8, AST/ALT normal, TC 170, TG 104, HDL 48, LDL 108, A1c 7.3 11/2021 - TSH normal   Past Medical History:  Diagnosis Date   CAD (coronary artery disease) 09/09/2012   Formatting of this note might be different from the original. A. 08/2012 STEMI. Cath: nl left main, L Cx with prox 70% stenosis, diffuse 60-80% stenosis in mid segment, LAD with 90% in the apical LAD, 60% ostial RCA, and 100% thrombotic occlusion to the distal RCA that was suspected to be culprit lesion. Cath team placed 2 x BMS to distal RCA lesion. B. 08/2012 echo: preserved LV function with infer  DOE (dyspnea on exertion) 10/07/2019   Essential hypertension 09/09/2012   Mixed hyperlipidemia 09/09/2012   Formatting of this note might be different from the original. 03/2019: TC 171 LDL 94 HDL 55 TG 109   Obesity, morbid (HCC) 04/08/2018   Statin intolerance 10/07/2019   Type 2 diabetes mellitus (HCC) 04/09/2018    Past Surgical History:  Procedure Laterality Date   BREAST EXCISIONAL BIOPSY Right 2010   Benign   CORONARY/GRAFT ACUTE MI REVASCULARIZATION N/A 04/08/2023    Procedure: Coronary/Graft Acute MI Revascularization;  Surgeon: Sammy Crisp, MD;  Location: [facility] INVASIVE CV LAB;  Service: Cardiovascular;  Laterality: N/A;   LEFT HEART CATH AND CORONARY ANGIOGRAPHY N/A 04/08/2023   Procedure: LEFT HEART CATH AND CORONARY ANGIOGRAPHY;  Surgeon: Sammy Crisp, MD;  Location: [facility] INVASIVE CV LAB;  Service: Cardiovascular;  Laterality: N/A;    Current Medications: Current Meds  Medication Sig   aspirin  81 MG EC tablet Take by mouth.   carvedilol  (COREG ) 6.25 MG tablet Take 1 tablet (6.25 mg total) by mouth 2 (two) times daily with a meal.   cholecalciferol (VITAMIN D3) 10 MCG (400 UNIT) TABS tablet Take 400 Units by mouth.   Coenzyme Q10 10 MG capsule Take by mouth.   cyanocobalamin 1000 MCG tablet Take by mouth.   Evolocumab (REPATHA SURECLICK) 140 MG/ML SOAJ Inject 140 mg into the skin every 14 (fourteen) days.   ezetimibe  (ZETIA ) 10 MG tablet TAKE 1 TABLET BY MOUTH DAILY   isosorbide  mononitrate (IMDUR ) 30 MG 24 hr tablet Take 1 tablet (30 mg total) by mouth daily.   Multiple Vitamin (MULTI-VITAMIN) tablet Take 1 tablet by mouth daily.   nitroGLYCERIN  (NITROSTAT ) 0.4 MG SL tablet Place 1 tablet (0.4 mg total) under the tongue every 5 (five) minutes as needed for chest pain.   NON FORMULARY Nitric Oxide Powder daily.   NON FORMULARY GreensBerry Takes powder daily.   Omega-3 Fatty Acids (OMEGA-3 2100) 1050 MG CAPS Take 1 tablet by mouth daily.   oxybutynin (DITROPAN-XL) 10 MG 24 hr tablet Take 10 mg by mouth at bedtime.   prasugrel  (EFFIENT ) 10 MG TABS tablet Take 1 tablet (10 mg total) by mouth daily.   valsartan  (DIOVAN ) 80 MG tablet TAKE 1 TABLET BY MOUTH DAILY    Allergies:   Lisinopril and Sulfa antibiotics   Social History   Socioeconomic History   Marital status: Married    Spouse name: Not on file   Number of children: Not on file   Years of education: Not on file   Highest education level: Not on file  Occupational History   Not  on file  Tobacco Use   Smoking status: Never   Smokeless tobacco: Never  Vaping Use   Vaping status: Never Used  Substance and Sexual Activity   Alcohol use: Yes    Alcohol/week: 1.0 standard drink of alcohol    Types: 1 Glasses of wine per week    Comment: states once or twice per year   Drug use: Never   Sexual activity: Not on file  Other Topics Concern   Not on file  Social History Narrative   Not on file   Social Drivers of Health   Financial Resource Strain: Low Risk  (06/14/2023)   Received from [facility]   Overall Financial Resource Strain (CARDIA)    Difficulty of Paying Living Expenses: Not hard at all  Food Insecurity: No Food Insecurity (06/14/2023)   Received from [facility]  Hunger Vital Sign    Worried About Running Out of Food in the Last Year: Never true    Ran Out of Food in the Last Year: Never true  Transportation Needs: No Transportation Needs (06/14/2023)   Received from [facility] - Transportation    In the past 12 months, has lack of transportation kept you from medical appointments or from getting medications?: No    Lack of Transportation (Non-Medical): No  Physical Activity: Not on file  Stress: Not on file  Social Connections: Not on file     Family History:  The patient's family history includes Breast cancer in her maternal aunt; Dementia in her mother; Diabetes in her mother; Heart failure in her father; Thyroid disease in her mother.  ROS:   12-point review of systems is negative unless otherwise noted in the HPI.   EKGs/Labs/Other Studies Reviewed:    Studies reviewed were summarized above. The additional studies were reviewed today:  2D echo 04/09/2023: 1. Left ventricular ejection fraction, by estimation, is 55 to 60%. The  left ventricle has normal function. The left ventricle demonstrates  regional wall motion abnormalities (see scoring diagram/findings for   description). There is mild left ventricular   hypertrophy. Left ventricular diastolic parameters are consistent with  Grade II diastolic dysfunction (pseudonormalization). Elevated left atrial  pressure. There is mild hypokinesis of the left ventricular, basal-mid  inferolateral wall.   2. Right ventricular systolic function is normal. The right ventricular  size is normal. There is normal pulmonary artery systolic pressure.   3. The mitral valve is abnormal. Mild mitral valve regurgitation. No  evidence of mitral stenosis.   4. The aortic valve is tricuspid. Aortic valve regurgitation is not  visualized. No aortic stenosis is present.  __________   [facility] 04/08/2023: Conclusions: Severe three-vessel coronary artery disease, including chronic total occlusions of the distal LAD and proximal LCx as well as sequential 90% ostial RCA and 100% mid RCA lesions (thrombotic occlusion of the mid RCA stent is the culprit for the patient's inferior STEMI). Patent proximal RCA bare-metal stent with approximately 20% diffuse in-stent restenosis. Normal left ventricular systolic function (LVEF 55-65%) with mildly elevated filling pressure (LVEDP 15 mmHg). Successful PCI to thrombotic occlusion of mid RCA using Onyx Frontier 3.5 x 34 mm drug-eluting stent (covers entire old mid RCA bare-metal stent) with 0% residual stenosis and TIMI-3 flow. Successful PCI to ostial RCA stenosis using Onyx Frontier 4.0 x 15 mm drug-eluting stent with 0% residual stenosis and TIMI-3 flow.   Recommendations: Dual antiplatelet therapy with aspirin  and prasugrel  for at least 12 months. Aggressive secondary prevention of coronary artery disease; will need to readdress high intensity statin therapy given reported history of statin intolerance in the past versus outpatient initiation of PCSK9 inhibitor therapy. Obtain echocardiogram. Medical management of chronic total occlusions of distal LAD and proximal LCx.   EKG:  EKG is  not ordered today.   Recent Labs: 04/09/2023: Magnesium  2.2 04/10/2023: ALT 46; BUN 16; Creatinine, Ser 0.71; Potassium 4.0; Sodium 137 04/19/2023: Hemoglobin 14.1; Platelets 222  Recent Lipid Panel    Component Value Date/Time   CHOL 188 04/09/2023 0055   TRIG 59 04/09/2023 0055   HDL 52 04/09/2023 0055   CHOLHDL 3.6 04/09/2023 0055   VLDL 12 04/09/2023 0055   LDLCALC 124 (H) 04/09/2023 0055    PHYSICAL EXAM:    VS:  BP (!) 143/84   Pulse 71   Ht 5\' 4"  (1.626 m)  Wt 259 lb 3.2 oz (117.6 kg)   SpO2 98%   BMI 44.49 kg/m   BMI: Body mass index is 44.49 kg/m.  Physical Exam Vitals reviewed.  Constitutional:      Appearance: She is well-developed.  HENT:     Head: Normocephalic and atraumatic.  Eyes:     General:        Right eye: No discharge.        Left eye: No discharge.  Cardiovascular:     Rate and Rhythm: Normal rate and regular rhythm.     Heart sounds: Normal heart sounds, S1 normal and S2 normal. Heart sounds not distant. No midsystolic click and no opening snap. No murmur heard.    No friction rub.  Pulmonary:     Effort: Pulmonary effort is normal. No respiratory distress.     Breath sounds: Normal breath sounds. No decreased breath sounds, wheezing, rhonchi or rales.  Chest:     Chest wall: No tenderness.  Musculoskeletal:     Cervical back: Normal range of motion.  Skin:    General: Skin is warm and dry.     Nails: There is no clubbing.  Neurological:     Mental Status: She is alert and oriented to person, place, and time.  Psychiatric:        Speech: Speech normal.        Behavior: Behavior normal.        Thought Content: Thought content normal.        Judgment: Judgment normal.     Wt Readings from Last 3 Encounters:  01/07/24 259 lb 3.2 oz (117.6 kg)  09/26/23 253 lb 3.2 oz (114.9 kg)  04/19/23 261 lb (118.4 kg)     ASSESSMENT & PLAN:   CAD involving the native coronary arteries with inferior STEMI without angina: She is doing well and  remains without symptoms concerning for angina or cardiac decompensation.  Continue DAPT with aspirin  81 mg and prasugrel  10 mg daily without interruption for a minimum of 12 months from date of PCI (03/2023) ideally longer given overlapping stents within the RCA.  She remains on ezetimibe  and Imdur  as well as carvedilol  as outlined below.  No indication for further ischemic testing at this time.  HTN: Blood pressure is mildly elevated in the office today, though she remains under increased stress surrounding the health of her mother who is under hospice care.  She remains on carvedilol , valsartan , and Imdur .  HLD with statin intolerance: LDL 108 in 05/2023 with target LDL less than 55.  Intolerant to rosuvastatin  secondary to myalgias.  We will undergo a trial of Repatha 140 mg injected every 14 days.  Continue ezetimibe  10 mg daily.     Disposition: F/u with Dr. Gollan or an APP in 3 months.   Medication Adjustments/Labs and Tests Ordered: Current medicines are reviewed at length with the patient today.  Concerns regarding medicines are outlined above. Medication changes, Labs and Tests ordered today are summarized above and listed in the Patient Instructions accessible in Encounters.   Signed, Varney Gentleman, PA-C 01/07/2024 5:11 PM     [facility] [place] [address] [place], [place] [address] [phone]

## 2024-01-07 NOTE — Patient Instructions (Signed)
 Medication Instructions:  Your physician recommends the following medication changes. START TAKING:  Repatha 140 mg subcutaneous injection once every 2 weeks  Follow-Up: At [facility], you and your health needs are our priority.  As part of our continuing mission to provide you with exceptional heart care, our providers are all part of one team.  This team includes your primary Cardiologist (physician) and Advanced Practice Providers or APPs (Physician Assistants and Nurse Practitioners) who all work together to provide you with the care you need, when you need it.  Your next appointment:   3 month(s)  Provider:   Varney Gentleman, PA-C    We recommend signing up for the patient portal called "MyChart".  Sign up information is provided on this After Visit Summary.  MyChart is used to connect with patients for Virtual Visits (Telemedicine).  Patients are able to view lab/test results, encounter notes, upcoming appointments, etc.  Non-urgent messages can be sent to your provider as well.   To learn more about what you can do with MyChart, go to [email or web address].

## 2024-01-08 ENCOUNTER — Other Ambulatory Visit: Payer: Self-pay | Admitting: Cardiovascular Disease

## 2024-01-08 ENCOUNTER — Telehealth: Payer: Self-pay

## 2024-01-08 ENCOUNTER — Other Ambulatory Visit (HOSPITAL_COMMUNITY): Payer: Self-pay

## 2024-01-08 DIAGNOSIS — Z79899 Other long term (current) drug therapy: Secondary | ICD-10-CM

## 2024-01-08 NOTE — Telephone Encounter (Signed)
 Pharmacy Patient Advocate Encounter   Received notification from CoverMyMeds that prior authorization for REPATHA is required/requested.   Insurance verification completed.   The patient is insured through [facility] .   Per test claim: PA required; PA submitted to above mentioned insurance via CoverMyMeds Key/confirmation #/EOC [id number] Status is pending

## 2024-01-09 NOTE — Telephone Encounter (Signed)
 Pharmacy Patient Advocate Encounter  Received notification from [facility] that Prior Authorization for REPATHA has been DENIED.  Full denial letter will be uploaded to the media tab. See denial reason below.

## 2024-01-11 NOTE — Telephone Encounter (Signed)
 I have reviewed the supplied criteria for PCSK9 approval with the patient's insurance.  Based on their documentation/criteria we have met the burden of approval.    We have documented that the patient cannot tolerate rosuvastatin  due to myalgias.  The patient is following a heart healthy diet.  Her LDL remains above goal.  Once this information is submitted?  Please look into this.

## 2024-01-14 NOTE — Telephone Encounter (Signed)
 Do we have an LDL C within the last 120 days?

## 2024-01-15 NOTE — Addendum Note (Signed)
 Addended by: Elvia Hammans on: 01/15/2024 01:29 PM   Modules accepted: Orders

## 2024-01-15 NOTE — Telephone Encounter (Signed)
 Called and spoke with patient. Informed patient that we need an updated Lipid panel for prior authorization. Patient verbalizes understanding and states that she will have it drawn tomorrow.

## 2024-01-16 ENCOUNTER — Other Ambulatory Visit: Payer: Self-pay

## 2024-01-16 ENCOUNTER — Other Ambulatory Visit: Payer: Self-pay | Admitting: Physician Assistant

## 2024-01-17 ENCOUNTER — Ambulatory Visit: Payer: Self-pay | Admitting: Physician Assistant

## 2024-01-17 LAB — LIPID PANEL
Chol/HDL Ratio: 3.2 ratio (ref 0.0–4.4)
Cholesterol, Total: 191 mg/dL (ref 100–199)
HDL: 59 mg/dL (ref >39)
LDL Chol Calc (NIH): 113 mg/dL — ABNORMAL HIGH (ref 0–99)
Triglycerides: 109 mg/dL (ref 0–149)
VLDL Cholesterol Cal: 19 mg/dL (ref 5–40)

## 2024-01-24 ENCOUNTER — Telehealth: Payer: Self-pay

## 2024-01-24 NOTE — Telephone Encounter (Signed)
 ERROR

## 2024-01-24 NOTE — Telephone Encounter (Signed)
 Pharmacy Patient Advocate Encounter   Received notification from Physician's Office that prior authorization for REPATHA  is required/requested.   Insurance verification completed.   The patient is insured through [employer] .   Per test claim: PA required; PA submitted to above mentioned insurance via CoverMyMeds Key/confirmation #/EOC [id number] Status is pending

## 2024-01-24 NOTE — Telephone Encounter (Signed)
-----   Message from [facility] sent at 01/17/2024 11:42 AM EDT ----- LDL remains above goal.  Target LDL less than 55.  Intolerant to statins.  Please include the updated lipid panel in her prior authorization information for approval of PCSK9 inhibitor.  I will forward to prior authorization pharmacy team for assistance as well.  Raneem Mendolia, here is the updated lipid panel to assist with the prior authorization for Repatha .  Thanks for your help!

## 2024-01-24 NOTE — Telephone Encounter (Deleted)
-----   Message from [facility] sent at 01/17/2024 11:42 AM EDT ----- LDL remains above goal.  Target LDL less than 55.  Intolerant to statins.  Please include the updated lipid panel in her prior authorization information for approval of PCSK9 inhibitor.  I will forward to prior authorization pharmacy team for assistance as well.  Raneem Mendolia, here is the updated lipid panel to assist with the prior authorization for Repatha .  Thanks for your help!

## 2024-01-25 ENCOUNTER — Other Ambulatory Visit (HOSPITAL_COMMUNITY): Payer: Self-pay

## 2024-01-25 NOTE — Telephone Encounter (Signed)
 Pharmacy Patient Advocate Encounter  Received notification from [facility] [employer]  that Prior Authorization for REPATHA  has been APPROVED from 01/25/24 to 04/26/24. Ran test claim, Copay is $553.04. This test claim was processed through [facility]- copay amounts may vary at other pharmacies due to pharmacy/plan contracts, or as the patient moves through the different stages of their insurance plan.

## 2024-02-05 ENCOUNTER — Other Ambulatory Visit (HOSPITAL_COMMUNITY): Payer: Self-pay

## 2024-02-05 NOTE — Telephone Encounter (Signed)
 Per test claim Praluent is not covered/non formulary on plan.

## 2024-02-05 NOTE — Telephone Encounter (Signed)
-----   Message from Nurse Babette Lesches sent at 02/04/2024  3:25 PM EDT ----- Thanks for running that test claim and without speaking for the pt that sounds undoable.  Can you run a test claim for Alirocumab (Praluent) and see if that one is better priced?  Thanks Kooper Godshall!  Dovie Gell ----- Message ----- From: Egbert Grass Sent: 02/03/2024   9:32 AM EDT To: Devon Fogo, RN  What do we need to do with this? Pharmacy tech routed to me. ----- Message ----- From: Sherlie Distance, Cathrine Coats Sent: 01/25/2024   2:24 PM EDT To: Roark Chick, PA-C

## 2024-02-08 ENCOUNTER — Other Ambulatory Visit: Payer: Self-pay

## 2024-02-08 ENCOUNTER — Other Ambulatory Visit (HOSPITAL_COMMUNITY): Payer: Self-pay

## 2024-02-08 ENCOUNTER — Telehealth: Payer: Self-pay

## 2024-02-08 NOTE — Telephone Encounter (Signed)
 Pharmacy Patient Advocate Encounter  Received notification from [facility] [employer] that Prior Authorization for NEXLETOL  has been APPROVED from 02/08/24 to 05/10/24. Ran test claim, Copay is $405.80. This test claim was processed through [facility]- copay amounts may vary at other pharmacies due to pharmacy/plan contracts, or as the patient moves through the different stages of their insurance plan.

## 2024-02-08 NOTE — Telephone Encounter (Signed)
 Pharmacy Patient Advocate Encounter   Received notification from Physician's Office that prior authorization for NEXLETOL  is required/requested.   Insurance verification completed.   The patient is insured through [facility] [employer] .   Per test claim: PA required; PA submitted to above mentioned insurance via CoverMyMeds Key/confirmation #/EOC [id number] Status is pending

## 2024-02-08 NOTE — Progress Notes (Signed)
 PA request has been Submitted. New Encounter has been or will be created for follow up. For additional info see Pharmacy Prior Auth telephone encounter from 02/08/24.

## 2024-02-08 NOTE — Telephone Encounter (Signed)
 Devon Fogo, RN to Me  (Selected Message)     02/07/24  6:54 PM [address] Pinon, Can you please try this one? And then I can try to get pt assistance involved if it's needed.   Thank you! Roark Chick, PA-C to Devon Fogo, RN      02/06/24  5:18 PM We can try bempedoic acid  180 mg daily and see what the cost is. Are there any patient assistance programs for any of these?  Devon Fogo, RN to Roark Chick, PA-C      02/06/24  5:08 PM Praluent is even higher than the Repatha .  Do you want to pivot to bempedoic acid ?  View older events    02/05/24 10:29 AM You routed this conversation to Devon Fogo, RN  Me    02/05/24 10:29 AM Note Per test claim Praluent is not covered/non formulary on plan.     Me    02/05/24 10:27 AM Note ----- Message from Nurse Babette Lesches sent at 02/04/2024  3:25 PM EDT ----- Thanks for running that test claim and without speaking for the pt that sounds undoable.  Can you run a test claim for Alirocumab (Praluent) and see if that one is better priced?   Thanks Clarke Peretz!   Dovie Gell ----- Message ----- From: Egbert Grass Sent: 02/03/2024   9:32 AM EDT To: Devon Fogo, RN   What do we need to do with this? Pharmacy tech routed to me. ----- Message ----- From: Sherlie Distance, Cathrine Coats Sent: 01/25/2024   2:24 PM EDT To: Roark Chick, PA-C       Devon Fogo, RN to Me  Devon Fogo, RN      02/04/24  3:28 PM Thanks for running that test claim and without speaking for the pt that sounds undoable.  Can you run a test claim for Alirocumab (Praluent) and see if that one is better priced?  Thanks Dak Szumski!  Derrill Flirt, Elvia Hammans, PA-C to Devon Fogo, RN      02/03/24  9:32 AM What do we need to do with this? Pharmacy tech routed to me.    01/25/24  2:24 PM You routed this conversation to Egbert Grass   Me    01/25/24  2:23 PM Note Pharmacy Patient Advocate Encounter   Received notification  from [facility] [employer]  that Prior Authorization for REPATHA  has been APPROVED from 01/25/24 to 04/26/24. Ran test claim, Copay is $553.04. This test claim was processed through [facility]- copay amounts may vary at other pharmacies due to pharmacy/plan contracts, or as the patient moves through the different stages of their insurance plan.            Me    01/24/24 10:35 AM Note Pharmacy Patient Advocate Encounter   Received notification from Physician's Office that prior authorization for REPATHA  is required/requested.   Insurance verification completed.   The patient is insured through [employer] .   Per test claim: PA required; PA submitted to above mentioned insurance via CoverMyMeds Key/confirmation #/EOC [id number] Status is pending       Me    01/24/24 10:32 AM Note ----- Message from Varney Gentleman sent at 01/17/2024 11:42 AM EDT ----- LDL remains above goal.  Target LDL less than 55.  Intolerant to statins.  Please include the updated lipid panel in her prior authorization information for approval of PCSK9 inhibitor.  I will forward to prior  authorization pharmacy team for assistance as well.   Kitti Mcclish, here is the updated lipid panel to assist with the prior authorization for Repatha .  Thanks for your help!       Roark Chick, PA-C to Devon Fogo, RN  Sherlie Distance, [facility]     01/17/24 11:42 AM Result Note LDL remains above goal.  Target LDL less than 55.  Intolerant to statins.  Please include the updated lipid panel in her prior authorization information for approval of PCSK9 inhibitor.  I will forward to prior authorization pharmacy team for assistance as well.  Laurieanne Galloway, here is the updated lipid panel to assist with the prior authorization for Repatha .  Thanks for your help!

## 2024-02-21 ENCOUNTER — Other Ambulatory Visit (HOSPITAL_COMMUNITY): Payer: Self-pay

## 2024-02-21 NOTE — Telephone Encounter (Signed)
 It appears the patient has Nurse, learning disability coverage so they wouldn't be eligible for PAP. Husband may have had a Medicare plan. Since patient does have a commercial plan, they should be able to utilize manufacturer copay cards.

## 2024-03-14 ENCOUNTER — Encounter: Payer: Self-pay | Admitting: Advanced Practice Midwife

## 2024-04-09 ENCOUNTER — Ambulatory Visit: Payer: Self-pay | Attending: Physician Assistant | Admitting: Physician Assistant

## 2024-04-09 ENCOUNTER — Telehealth: Payer: Self-pay

## 2024-04-09 ENCOUNTER — Encounter: Payer: Self-pay | Admitting: Physician Assistant

## 2024-04-09 ENCOUNTER — Other Ambulatory Visit (HOSPITAL_COMMUNITY): Payer: Self-pay

## 2024-04-09 VITALS — BP 142/88 | HR 68 | Ht 64.0 in | Wt 254.0 lb

## 2024-04-09 DIAGNOSIS — I25118 Atherosclerotic heart disease of native coronary artery with other forms of angina pectoris: Secondary | ICD-10-CM

## 2024-04-09 DIAGNOSIS — I10 Essential (primary) hypertension: Secondary | ICD-10-CM

## 2024-04-09 DIAGNOSIS — Z789 Other specified health status: Secondary | ICD-10-CM

## 2024-04-09 DIAGNOSIS — E785 Hyperlipidemia, unspecified: Secondary | ICD-10-CM

## 2024-04-09 MED ORDER — PRALUENT 75 MG/ML ~~LOC~~ SOAJ
75.0000 mg | SUBCUTANEOUS | 12 refills | Status: AC
Start: 2024-04-09 — End: ?

## 2024-04-09 MED ORDER — ISOSORBIDE MONONITRATE ER 60 MG PO TB24: 60.0000 mg | ORAL_TABLET | Freq: Every day | ORAL | 3 refills | Status: AC

## 2024-04-09 NOTE — Progress Notes (Signed)
 Cardiology Office Note    Date:  04/09/2024   ID:  Denise [name], DOB 1961-01-02, MRN [id number]  PCP:  Denise Eva POUR, PA  Cardiologist:  Evalene Lunger, MD  Electrophysiologist:  None   Chief Complaint: Follow up  History of Present Illness:   Denise [name] is a 63 y.o. female with history of CAD with inferior ST elevation MI in 2014 s/p BMS stents to the RCA and known CTO of the LCx with inferior ST elevation MI in 03/2023 status post PCI/DES to the RCA x 2 as outlined below, borderline diabetes, HTN, and HLD with statin intolerance who presents for follow up of CAD.   She was previously followed by [facility] cardiology, transitioning her care to Dr. Gollan in 2022.  She was admitted in 08/2012 with an inferior STEMI and underwent PCI/BMS x2 to the mid RCA and RPAV.  She was admitted to [facility] in 03/2023 with an inferior ST elevation MI.  High-sensitivity troponin trended to 1509.  LHC on 04/08/2023 showed severe three-vessel CAD including chronic total occlusions of the distal LAD and proximal LCx as well as sequential 90% ostial RCA and 100% mid RCA lesions (thrombotic occlusion of the mid RCA stent was the culprit for the patient's inferior ST elevation MI).  Patent proximal RCA BMS with approximately 20% diffuse in-stent restenosis.  Normal LV systolic function with an LVEF of 55 to 65% with mildly elevated filling pressures.  She underwent successful PCI to thrombotic occlusion of mid RCA (covered entire old mid RCA BMS) as well as successful PCI to ostial RCA stenosis.  Echo during the admission showed an EF of 55 to 60%, grade 2 diastolic dysfunction, mild hypokinesis of the basal mid inferolateral wall, normal RV systolic function, ventricular cavity size, and RVSP, and mild mitral regurgitation.   Notes indicate she has previously declined statin and did not want PCSK9 inhibitor.     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  In this setting, we recommended a trial of bempedoic acid  with continuation of ezetimibe .  She was seen in the office in 08/2023 and reported she was not able to pick up bempedoic acid .  Mild intermittent lower extremity swelling remained stable.  She was last seen in the office in 12/2023 and was doing well from a cardiac perspective.  She remained unable to obtain bempedoic acid  secondary to financial constraints.  She was interested in pursuing PCSK9 inhibitor.  Following updated lipid panel, she was started on Repatha .  She comes in doing well from a cardiac perspective and is without symptoms of angina or cardiac decompensation.  She has been unable to obtain Nexletol  or Repatha  secondary to financial constraints.  No longer has health insurance.  She also notes a couple episodes of exertional shortness of breath and jaw discomfort when walking to and from the parking deck at [facility].  She notes jaw discomfort was a prior anginal equivalent for her.  However, she also notes jaw discomfort with seasonal change and allergies.  She continues to be under increased stress surrounding the health of multiple family members.  No symptoms of dizziness, presyncope, or syncope.  No falls or symptoms concerning for bleeding.   Labs independently reviewed: 12/2023 - TC 191, TG 109, HDL 59, LDL 113 05/2023 - Hgb 15.1, PLT 215, potassium 4.7, BUN 21, serum creatinine 0.7, albumin 3.8, AST/ALT normal, A1c 7.3 11/2021 - TSH normal  Past Medical History:  Diagnosis Date   CAD (coronary artery disease) 09/09/2012   Formatting of this note might be different from the original. A. 08/2012 STEMI. Cath: nl left main, L Cx with prox 70% stenosis, diffuse 60-80% stenosis in mid segment, LAD with 90% in the apical LAD, 60% ostial RCA, and 100% thrombotic occlusion to the distal RCA that was suspected to be culprit lesion. Cath team placed 2 x BMS to distal RCA lesion. B. 08/2012 echo: preserved LV  function with infer   DOE (dyspnea on exertion) 10/07/2019   Essential hypertension 09/09/2012   Mixed hyperlipidemia 09/09/2012   Formatting of this note might be different from the original. 03/2019: TC 171 LDL 94 HDL 55 TG 109   Obesity, morbid (HCC) 04/08/2018   Statin intolerance 10/07/2019   Type 2 diabetes mellitus (HCC) 04/09/2018    Past Surgical History:  Procedure Laterality Date   BREAST EXCISIONAL BIOPSY Right 2010   Benign   CORONARY/GRAFT ACUTE MI REVASCULARIZATION N/A 04/08/2023   Procedure: Coronary/Graft Acute MI Revascularization;  Surgeon: Mady Bruckner, MD;  Location: [facility] INVASIVE CV LAB;  Service: Cardiovascular;  Laterality: N/A;   LEFT HEART CATH AND CORONARY ANGIOGRAPHY N/A 04/08/2023   Procedure: LEFT HEART CATH AND CORONARY ANGIOGRAPHY;  Surgeon: Mady Bruckner, MD;  Location: [facility] INVASIVE CV LAB;  Service: Cardiovascular;  Laterality: N/A;    Current Medications: Current Meds  Medication Sig   Alirocumab  (PRALUENT ) 75 MG/ML SOAJ Inject 1 mL (75 mg total) into the skin every 14 (fourteen) days.   aspirin  81 MG EC tablet Take by mouth.   carvedilol  (COREG ) 6.25 MG tablet Take 1 tablet (6.25 mg total) by mouth 2 (two) times daily with a meal.   cholecalciferol (VITAMIN D3) 10 MCG (400 UNIT) TABS tablet Take 400 Units by mouth.   Coenzyme Q10 10 MG capsule Take by mouth.   cyanocobalamin 1000 MCG tablet Take by mouth.   ezetimibe  (ZETIA ) 10 MG tablet TAKE 1 TABLET BY MOUTH DAILY   Multiple Vitamin (MULTI-VITAMIN) tablet Take 1 tablet by mouth daily.   nitroGLYCERIN  (NITROSTAT ) 0.4 MG SL tablet Place 1 tablet (0.4 mg total) under the tongue every 5 (five) minutes as needed for chest pain.   NON FORMULARY Nitric Oxide Powder daily.   NON FORMULARY GreensBerry Takes powder daily.   Omega-3 Fatty Acids (OMEGA-3 2100) 1050 MG CAPS Take 1 tablet by mouth daily.   oxybutynin (DITROPAN-XL) 10 MG 24 hr tablet Take 10 mg by mouth at bedtime.   valsartan  (DIOVAN )  80 MG tablet TAKE 1 TABLET BY MOUTH DAILY   [DISCONTINUED] isosorbide  mononitrate (IMDUR ) 30 MG 24 hr tablet TAKE 1 TABLET BY MOUTH DAILY    Allergies:   Lisinopril and Sulfa antibiotics   Social History   Socioeconomic History   Marital status: Married    Spouse name: Not on file   Number of children: Not on file   Years of education: Not on file   Highest education level: Not on file  Occupational History   Not on file  Tobacco Use   Smoking status: Never   Smokeless tobacco: Never  Vaping Use   Vaping status: Never Used  Substance and Sexual Activity   Alcohol use: Yes    Alcohol/week: 1.0 standard drink of alcohol    Types: 1 Glasses of wine per week    Comment: states once or twice per year   Drug use: Never   Sexual activity: Not on file  Other Topics Concern  Not on file  Social History Narrative   Not on file   Social Drivers of Health   Financial Resource Strain: Low Risk  (06/14/2023)   Received from [facility]   Overall Financial Resource Strain (CARDIA)    Difficulty of Paying Living Expenses: Not hard at all  Food Insecurity: No Food Insecurity (06/14/2023)   [facility]   Hunger Vital Sign    Within the past 12 months, you worried that your food would run out before you got the money to buy more.: Never true    Within the past 12 months, the food you bought just didn't last and you didn't have money to get more.: Never true  Transportation Needs: No Transportation Needs (06/14/2023)   Received from [facility] - Transportation    In the past 12 months, has lack of transportation kept you from medical appointments or from getting medications?: No    Lack of Transportation (Non-Medical): No  Physical Activity: Not on file  Stress: Not on file  Social Connections: Not on file     Family History:  The patient's family history includes Breast cancer in her maternal aunt;  Dementia in her mother; Diabetes in her mother; Heart failure in her father; Thyroid disease in her mother.  ROS:   12-point review of systems is negative unless otherwise noted in the HPI.   EKGs/Labs/Other Studies Reviewed:    Studies reviewed were summarized above. The additional studies were reviewed today:  2D echo 04/09/2023: 1. Left ventricular ejection fraction, by estimation, is 55 to 60%. The  left ventricle has normal function. The left ventricle demonstrates  regional wall motion abnormalities (see scoring diagram/findings for  description). There is mild left ventricular   hypertrophy. Left ventricular diastolic parameters are consistent with  Grade II diastolic dysfunction (pseudonormalization). Elevated left atrial  pressure. There is mild hypokinesis of the left ventricular, basal-mid  inferolateral wall.   2. Right ventricular systolic function is normal. The right ventricular  size is normal. There is normal pulmonary artery systolic pressure.   3. The mitral valve is abnormal. Mild mitral valve regurgitation. No  evidence of mitral stenosis.   4. The aortic valve is tricuspid. Aortic valve regurgitation is not  visualized. No aortic stenosis is present.  __________   [facility] 04/08/2023: Conclusions: Severe three-vessel coronary artery disease, including chronic total occlusions of the distal LAD and proximal LCx as well as sequential 90% ostial RCA and 100% mid RCA lesions (thrombotic occlusion of the mid RCA stent is the culprit for the patient's inferior STEMI). Patent proximal RCA bare-metal stent with approximately 20% diffuse in-stent restenosis. Normal left ventricular systolic function (LVEF 55-65%) with mildly elevated filling pressure (LVEDP 15 mmHg). Successful PCI to thrombotic occlusion of mid RCA using Onyx Frontier 3.5 x 34 mm drug-eluting stent (covers entire old mid RCA bare-metal stent) with 0% residual stenosis and TIMI-3 flow. Successful PCI to ostial  RCA stenosis using Onyx Frontier 4.0 x 15 mm drug-eluting stent with 0% residual stenosis and TIMI-3 flow.   Recommendations: Dual antiplatelet therapy with aspirin  and prasugrel  for at least 12 months. Aggressive secondary prevention of coronary artery disease; will need to readdress high intensity statin therapy given reported history of statin intolerance in the past versus outpatient initiation of PCSK9 inhibitor therapy. Obtain echocardiogram. Medical management of chronic total occlusions of distal LAD and proximal LCx.   EKG:  EKG is ordered today.  The  EKG ordered today demonstrates NSR, 66 bpm, first-degree AV block, incomplete RBBB, possible prior inferior and anterior infarct   Recent Labs: 04/19/2023: Hemoglobin 14.1; Platelets 222  Recent Lipid Panel    Component Value Date/Time   CHOL 191 01/16/2024 1119   TRIG 109 01/16/2024 1119   HDL 59 01/16/2024 1119   CHOLHDL 3.2 01/16/2024 1119   CHOLHDL 3.6 04/09/2023 0055   VLDL 12 04/09/2023 0055   LDLCALC 113 (H) 01/16/2024 1119    PHYSICAL EXAM:    VS:  BP (!) 142/88   Pulse 68   Ht 5' 4 (1.626 m)   Wt 254 lb (115.2 kg)   SpO2 97%   BMI 43.60 kg/m   BMI: Body mass index is 43.6 kg/m.  Physical Exam Vitals reviewed.  Constitutional:      Appearance: She is well-developed.  HENT:     Head: Normocephalic and atraumatic.  Eyes:     General:        Right eye: No discharge.        Left eye: No discharge.  Cardiovascular:     Rate and Rhythm: Normal rate and regular rhythm.     Pulses:          Posterior tibial pulses are 2+ on the right side and 2+ on the left side.     Heart sounds: Normal heart sounds, S1 normal and S2 normal. Heart sounds not distant. No midsystolic click and no opening snap. No murmur heard.    No friction rub.  Pulmonary:     Effort: Pulmonary effort is normal. No respiratory distress.     Breath sounds: Normal breath sounds. No decreased breath sounds, wheezing, rhonchi or rales.   Musculoskeletal:     Cervical back: Normal range of motion.     Right lower leg: No edema.     Left lower leg: No edema.  Skin:    General: Skin is warm and dry.     Nails: There is no clubbing.  Neurological:     Mental Status: She is alert and oriented to person, place, and time.  Psychiatric:        Speech: Speech normal.        Behavior: Behavior normal.        Thought Content: Thought content normal.        Judgment: Judgment normal.     Wt Readings from Last 3 Encounters:  04/09/24 254 lb (115.2 kg)  01/07/24 259 lb 3.2 oz (117.6 kg)  09/26/23 253 lb 3.2 oz (114.9 kg)     ASSESSMENT & PLAN:   CAD involving the native coronary arteries with inferior STEMI with stable angina: Currently without symptoms of angina or cardiac decompensation.  She does note some exertional shortness of breath and exertional jaw pain concerning for possible prior anginal equivalent.  However, she notes this jaw pain is also noticeable with season changes/allergies.  She would like to defer further cardiac testing at this time given lack of insurance and notify us  if symptoms become more noticeable or worse.  Low threshold for further cardiac testing.  For now, continue DAPT with aspirin  81 mg and prasugrel  10 mg given multiple overlapping stents within the RCA.  She remains on ezetimibe  with titration of Imdur  as outlined below given elevated blood pressure.  HTN: Blood pressure is mildly elevated in the office today, likely in the setting of increased stress surrounding the health of multiple family members.  Titrate Imdur  to 60 mg daily with  continuation of carvedilol  6.25 mg twice daily and valsartan  80 mg daily.  HLD with statin intolerance: LDL 113 in 12/2023 with target LDL less than 55.  Intolerant to rosuvastatin  secondary to myalgias.  Unable to obtain Nexletol  or Repatha  secondary to financial constraints (husband already receives patient assistance for his Repatha ).  She has lost her health  insurance.  We will undergo a trial of Praluent  75 mg injected every 2 weeks with patient assistance.  Continue ezetimibe  10 mg.      Disposition: F/u with Dr. Gollan or an APP in 3 months.   Medication Adjustments/Labs and Tests Ordered: Current medicines are reviewed at length with the patient today.  Concerns regarding medicines are outlined above. Medication changes, Labs and Tests ordered today are summarized above and listed in the Patient Instructions accessible in Encounters.   Signed, Bernardino Bring, PA-C 04/09/2024 5:27 PM     [facility] [place] [address] [place], [place] [address] [phone]

## 2024-04-09 NOTE — Telephone Encounter (Signed)
-----   Message from Nurse Marcus ORN sent at 04/09/2024  2:46 PM EDT ----- Regarding: Praluent  assistance Hi all,  This pt had requested med assist about 2 months ago but was denied. I think because her husband had already had been helped with Repatha .  But now this pt no longer has insurance! Can she get help? Thanks!  Marcus RN

## 2024-04-09 NOTE — Telephone Encounter (Signed)
 PAP: Patient assistance application for PRALUENT  through [employer] has been mailed to pt's home address on file. Provider portion of application will be faxed to provider's office once pt portion is received.

## 2024-04-09 NOTE — Patient Instructions (Signed)
 Medication Instructions:  Your physician recommends the following medication changes.  START TAKING: Praluent  75 mg subcutaneous injection every 2 weeks  INCREASE: Imdur  60 mg daily   *If you need a refill on your cardiac medications before your next appointment, please call your pharmacy*  Lab Work: None ordered at this time   Follow-Up: At [facility], you and your health needs are our priority.  As part of our continuing mission to provide you with exceptional heart care, our providers are all part of one team.  This team includes your primary Cardiologist (physician) and Advanced Practice Providers or APPs (Physician Assistants and Nurse Practitioners) who all work together to provide you with the care you need, when you need it.  Your next appointment:   3 month(s)  Provider:   You may see Timothy Gollan, MD or Bernardino Bring, PA-C  We recommend signing up for the patient portal called MyChart.  Sign up information is provided on this After Visit Summary.  MyChart is used to connect with patients for Virtual Visits (Telemedicine).  Patients are able to view lab/test results, encounter notes, upcoming appointments, etc.  Non-urgent messages can be sent to your provider as well.   To learn more about what you can do with MyChart, go to [email or web address].

## 2024-04-10 NOTE — Telephone Encounter (Signed)
 Spoke to pt at yesterday's office visit and pt reports no longer has insurance - this med attempt will be replaced by another med

## 2024-04-29 ENCOUNTER — Telehealth: Payer: Self-pay | Admitting: Pharmacy Technician

## 2024-04-29 ENCOUNTER — Other Ambulatory Visit (HOSPITAL_COMMUNITY): Payer: Self-pay

## 2024-04-29 NOTE — Telephone Encounter (Signed)
 She has been unable to obtain Nexletol  or Repatha  secondary to financial constraints. No longer has health insurance.   Cancelled pa renewal

## 2024-04-30 NOTE — Telephone Encounter (Signed)
 2ND ATTEMPT PAP: Patient assistance application for PRALUENT  through [employer] has been mailed to pt's home address on file. Provider portion of application will be faxed to provider's office once pt portion is received.

## 2024-05-14 NOTE — Telephone Encounter (Signed)
 Lmom for patient- 3RD ATTEMPT  PAP: Patient assistance application for PRALUENT  through [employer] has been mailed to pt's home address on file. Provider portion of application will be faxed to provider's office once pt portion is received.

## 2024-05-28 NOTE — Telephone Encounter (Signed)
 Unable to obtain requested info from patient, closing encounter.

## 2024-06-19 ENCOUNTER — Other Ambulatory Visit: Payer: Self-pay | Admitting: Cardiovascular Disease

## 2024-06-26 ENCOUNTER — Other Ambulatory Visit: Payer: Self-pay | Admitting: Cardiovascular Disease

## 2024-07-11 ENCOUNTER — Ambulatory Visit: Payer: Self-pay | Admitting: Cardiovascular Disease

## 2024-07-14 ENCOUNTER — Ambulatory Visit: Payer: Self-pay | Admitting: Physician Assistant

## 2024-07-17 ENCOUNTER — Other Ambulatory Visit: Payer: Self-pay | Admitting: Cardiovascular Disease

## 2024-09-06 NOTE — Progress Notes (Unsigned)
 "  Cardiology Office Note    Date:  09/08/2024   ID:  Denise [name], DOB March 20, 1961, MRN [id number]  PCP:  Marikay Eva POUR, PA  Cardiologist:  Evalene Lunger, MD  Electrophysiologist:  None   Chief Complaint: Follow up  History of Present Illness:   Denise [name] is a 64 y.o. female with history of CAD with inferior ST elevation MI in 2014 s/p BMS stents to the RCA and known CTO of the LCx with inferior ST elevation MI in 03/2023 status post PCI/DES to the RCA x 2 as outlined below, borderline diabetes, HTN, and HLD with statin intolerance who presents for follow-up of CAD.  She was previously followed by [facility] cardiology, transitioning her care to Dr. Gollan in 2022.  She was admitted in 08/2012 with an inferior STEMI and underwent PCI/BMS x2 to the mid RCA and RPAV.  She was admitted to [facility] in 03/2023 with an inferior ST elevation MI.  High-sensitivity troponin trended to 1509.  LHC on 04/08/2023 showed severe three-vessel CAD including chronic total occlusions of the distal LAD and proximal LCx as well as sequential 90% ostial RCA and 100% mid RCA lesions (thrombotic occlusion of the mid RCA stent was the culprit for the patient's inferior ST elevation MI).  Patent proximal RCA BMS with approximately 20% diffuse in-stent restenosis.  Normal LV systolic function with an LVEF of 55 to 65% with mildly elevated filling pressures.  She underwent successful PCI to thrombotic occlusion of mid RCA (covered entire old mid RCA BMS) as well as successful PCI to ostial RCA stenosis.  Echo during the admission showed an EF of 55 to 60%, grade 2 diastolic dysfunction, mild hypokinesis of the basal mid inferolateral wall, normal RV systolic function, ventricular cavity size, and RVSP, and mild mitral regurgitation.   Notes indicate she has previously declined statin and did not want PCSK9 inhibitor.     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  In this setting, we recommended a trial of bempedoic acid  with continuation of ezetimibe .  She was seen in the office in 08/2023 and reported she was not able to pick up bempedoic acid .  Mild intermittent lower extremity swelling remained stable.  At follow up in 12/2023, she remained unable to obtain bempedoic acid  secondary to financial constraints.  She was interested in pursuing PCSK9 inhibitor.  Following updated lipid panel, she was started on Repatha .  She was most recently seen in the office in 03/2024 and remained unable to obtain Nexletol  or Repatha  secondary to financial constraints.  She no longer had health insurance.  She did report a couple episodes of exertional shortness of breath and jaw discomfort with ambulation angina, though she also noted season changes and allergies.  She preferred to defer further cardiac testing at that time.  She was referred to patient assistance, however paperwork was not received by our pharmacy team.  She comes in doing well from a cardiac perspective and is without current symptoms of angina or cardiac decompensation.  She has been under increased stress following the loss of one of her daughters and another daughter that underwent emergent C-section at [facility].  In this setting she has not yet completed paperwork for PCSK9 inhibitor.  She notes chronic stable exertional shortness of breath if she is walking at a fast pace as well as some general discomfort with this but is very mild and similar to what  she was experiencing at her visit in August 2025.  If she is not walking at a fast pace she can ambulate for significant distances without shortness of breath.  No progressive lower extremity swelling or orthopnea.  No dizziness, presyncope, or syncope.  No falls or symptoms concerning for bleeding.  Would like to defer further cardiac testing or pharmacotherapy at this time in the setting of home stressors.   Labs independently  reviewed: 12/2023 - TC 191, TG 109, HDL 59, LDL 113 05/2023 - Hgb 15.1, PLT 215, potassium 4.7, BUN 21, serum creatinine 0.7, albumin 3.8, AST/ALT normal, A1c 7.3 11/2021 - TSH normal  Past Medical History:  Diagnosis Date   CAD (coronary artery disease) 09/09/2012   Formatting of this note might be different from the original. A. 08/2012 STEMI. Cath: nl left main, L Cx with prox 70% stenosis, diffuse 60-80% stenosis in mid segment, LAD with 90% in the apical LAD, 60% ostial RCA, and 100% thrombotic occlusion to the distal RCA that was suspected to be culprit lesion. Cath team placed 2 x BMS to distal RCA lesion. B. 08/2012 echo: preserved LV function with infer   DOE (dyspnea on exertion) 10/07/2019   Essential hypertension 09/09/2012   Mixed hyperlipidemia 09/09/2012   Formatting of this note might be different from the original. 03/2019: TC 171 LDL 94 HDL 55 TG 109   Obesity, morbid (HCC) 04/08/2018   Statin intolerance 10/07/2019   Type 2 diabetes mellitus (HCC) 04/09/2018    Past Surgical History:  Procedure Laterality Date   BREAST EXCISIONAL BIOPSY Right 2010   Benign   CORONARY/GRAFT ACUTE MI REVASCULARIZATION N/A 04/08/2023   Procedure: Coronary/Graft Acute MI Revascularization;  Surgeon: Mady Bruckner, MD;  Location: [facility] INVASIVE CV LAB;  Service: Cardiovascular;  Laterality: N/A;   LEFT HEART CATH AND CORONARY ANGIOGRAPHY N/A 04/08/2023   Procedure: LEFT HEART CATH AND CORONARY ANGIOGRAPHY;  Surgeon: Mady Bruckner, MD;  Location: [facility] INVASIVE CV LAB;  Service: Cardiovascular;  Laterality: N/A;    Current Medications: Active Medications[1]  Allergies:   Lisinopril and Sulfa antibiotics   Social History   Socioeconomic History   Marital status: Married    Spouse name: Not on file   Number of children: Not on file   Years of education: Not on file   Highest education level: Not on file  Occupational History   Not on file  Tobacco Use   Smoking status: Never    Smokeless tobacco: Never  Vaping Use   Vaping status: Never Used  Substance and Sexual Activity   Alcohol use: Yes    Alcohol/week: 1.0 standard drink of alcohol    Types: 1 Glasses of wine per week    Comment: states once or twice per year   Drug use: Never   Sexual activity: Not on file  Other Topics Concern   Not on file  Social History Narrative   Not on file   Social Drivers of Health   Tobacco Use: Low Risk (09/08/2024)   Patient History    Smoking Tobacco Use: Never    Smokeless Tobacco Use: Never    Passive Exposure: Not on file  Financial Resource Strain: Low Risk  (06/14/2023)   Received from [facility]   Overall Financial Resource Strain (CARDIA)    Difficulty of Paying Living Expenses: Not hard at all  Food Insecurity: No Food Insecurity (06/14/2023)   Received from [facility]   Epic    Within the past  12 months, you worried that your food would run out before you got the money to buy more.: Never true    Within the past 12 months, the food you bought just didn't last and you didn't have money to get more.: Never true  Transportation Needs: No Transportation Needs (06/14/2023)   Received from [facility] - Transportation    In the past 12 months, has lack of transportation kept you from medical appointments or from getting medications?: No    Lack of Transportation (Non-Medical): No  Physical Activity: Not on file  Stress: Not on file  Social Connections: Not on file  Depression (EYV7-0): Not on file  Alcohol Screen: Not on file  Housing: Unknown (06/14/2023)   Received from [facility]   Epic    In the last 12 months, was there a time when you were not able to pay the mortgage or rent on time?: No    Number of Times Moved in the Last Year: Not on file    At any time in the past 12 months, were you homeless or living in a shelter (including now)?: No  Utilities: Not At Risk  (06/14/2023)   Received from [facility] Utilities    Threatened with loss of utilities: No  Health Literacy: Not on file     Family History:  The patient's family history includes Breast cancer in her maternal aunt; Dementia in her mother; Diabetes in her mother; Heart failure in her father; Thyroid disease in her mother.  ROS:   12-point review of systems is negative unless otherwise noted in the HPI.   EKGs/Labs/Other Studies Reviewed:    Studies reviewed were summarized above. The additional studies were reviewed today:  2D echo 04/09/2023: 1. Left ventricular ejection fraction, by estimation, is 55 to 60%. The  left ventricle has normal function. The left ventricle demonstrates  regional wall motion abnormalities (see scoring diagram/findings for  description). There is mild left ventricular   hypertrophy. Left ventricular diastolic parameters are consistent with  Grade II diastolic dysfunction (pseudonormalization). Elevated left atrial  pressure. There is mild hypokinesis of the left ventricular, basal-mid  inferolateral wall.   2. Right ventricular systolic function is normal. The right ventricular  size is normal. There is normal pulmonary artery systolic pressure.   3. The mitral valve is abnormal. Mild mitral valve regurgitation. No  evidence of mitral stenosis.   4. The aortic valve is tricuspid. Aortic valve regurgitation is not  visualized. No aortic stenosis is present.  __________   [facility] 04/08/2023: Conclusions: Severe three-vessel coronary artery disease, including chronic total occlusions of the distal LAD and proximal LCx as well as sequential 90% ostial RCA and 100% mid RCA lesions (thrombotic occlusion of the mid RCA stent is the culprit for the patient's inferior STEMI). Patent proximal RCA bare-metal stent with approximately 20% diffuse in-stent restenosis. Normal left ventricular systolic function (LVEF 55-65%) with mildly elevated  filling pressure (LVEDP 15 mmHg). Successful PCI to thrombotic occlusion of mid RCA using Onyx Frontier 3.5 x 34 mm drug-eluting stent (covers entire old mid RCA bare-metal stent) with 0% residual stenosis and TIMI-3 flow. Successful PCI to ostial RCA stenosis using Onyx Frontier 4.0 x 15 mm drug-eluting stent with 0% residual stenosis and TIMI-3 flow.   Recommendations: Dual antiplatelet therapy with aspirin  and prasugrel  for at least 12 months. Aggressive secondary prevention of coronary artery disease; will need to readdress high intensity  statin therapy given reported history of statin intolerance in the past versus outpatient initiation of PCSK9 inhibitor therapy. Obtain echocardiogram. Medical management of chronic total occlusions of distal LAD and proximal LCx.   EKG:  EKG is ordered today.  The EKG ordered today demonstrates NSR, 73 bpm, left axis deviation, first-degree AV block, occasional PVCs, prior inferior infarct, possible prior anterior infarct, consistent with prior tracings  Recent Labs: No results found for requested labs within last 365 days.  Recent Lipid Panel    Component Value Date/Time   CHOL 191 01/16/2024 1119   TRIG 109 01/16/2024 1119   HDL 59 01/16/2024 1119   CHOLHDL 3.2 01/16/2024 1119   CHOLHDL 3.6 04/09/2023 0055   VLDL 12 04/09/2023 0055   LDLCALC 113 (H) 01/16/2024 1119    PHYSICAL EXAM:    VS:  BP (!) 130/90 (BP Location: Left Arm, Patient Position: Sitting, Cuff Size: Large)   Pulse 73 Comment: 80oximeter  Ht 5' 4 (1.626 m)   Wt 251 lb (113.9 kg)   SpO2 98%   BMI 43.08 kg/m   BMI: Body mass index is 43.08 kg/m.  Physical Exam Vitals reviewed.  Constitutional:      Appearance: She is well-developed.  HENT:     Head: Normocephalic and atraumatic.  Eyes:     General:        Right eye: No discharge.        Left eye: No discharge.  Cardiovascular:     Rate and Rhythm: Normal rate and regular rhythm.     Heart sounds: Normal heart  sounds, S1 normal and S2 normal. Heart sounds not distant. No midsystolic click and no opening snap. No murmur heard.    No friction rub.  Pulmonary:     Effort: Pulmonary effort is normal. No respiratory distress.     Breath sounds: Normal breath sounds. No decreased breath sounds, wheezing, rhonchi or rales.  Musculoskeletal:     Cervical back: Normal range of motion.  Skin:    General: Skin is warm and dry.     Nails: There is no clubbing.  Neurological:     Mental Status: She is alert and oriented to person, place, and time.  Psychiatric:        Speech: Speech normal.        Behavior: Behavior normal.        Thought Content: Thought content normal.        Judgment: Judgment normal.     Wt Readings from Last 3 Encounters:  09/08/24 251 lb (113.9 kg)  04/09/24 254 lb (115.2 kg)  01/07/24 259 lb 3.2 oz (117.6 kg)     ASSESSMENT & PLAN:   CAD involving the native coronary arteries with inferior NSTEMI with stable angina: Currently without symptoms of angina or cardiac decompensation.  She does continue to note stable exertional shortness of breath if she is ambulating at a quick pace as well as some exertional jaw discomfort.  She would like to continue to defer further cardiac testing at this time given stable symptoms and in the context of increased home stressors.  Low threshold for further cardiac testing.  Continue aggressive risk factor modification and secondary prevention including DAPT with aspirin  81 mg and prasugrel  10 mg given multiple overlapping stents within the RCA as well as ezetimibe  10 mg carvedilol  6.25 mg twice daily, and Imdur  60 mg.  HTN: Blood pressure is reasonably controlled in the office today given increased stressors.  Remains on carvedilol  6.25  mg twice daily, Imdur  60 mg, and valsartan  80 mg.  HLD with statin intolerance: LDL 113 in 12/2023 with target LDL < 55.  Intolerant to rosuvastatin  secondary to myalgias. Unable to obtain Nexletol  or Repatha   secondary to financial constraints (husband already receives patient assistance for his Repatha ).  Previously provided prescription for Praluent  and patient assistance paperwork which remain pending.  She would like to defer initiation of further pharmacotherapy at this time given increased stressors and would like to revisit this in follow-up.  Remains on ezetimibe  10 mg.     Disposition: F/u with Dr. Gollan or an APP in 3 months.   Medication Adjustments/Labs and Tests Ordered: Current medicines are reviewed at length with the patient today.  Concerns regarding medicines are outlined above. Medication changes, Labs and Tests ordered today are summarized above and listed in the Patient Instructions accessible in Encounters.   Signed, Bernardino Bring, PA-C 09/08/2024 5:11 PM     [facility] [address] [place], [place] [address] [phone]     [1]  Current Meds  Medication Sig   aspirin  81 MG EC tablet Take by mouth.   carvedilol  (COREG ) 6.25 MG tablet TAKE 1 TABLET BY MOUTH TWICE A DAY WITH A MEAL   cholecalciferol (VITAMIN D3) 10 MCG (400 UNIT) TABS tablet Take 400 Units by mouth.   Coenzyme Q10 10 MG capsule Take by mouth.   cyanocobalamin 1000 MCG tablet Take by mouth.   ezetimibe  (ZETIA ) 10 MG tablet TAKE 1 TABLET BY MOUTH DAILY   isosorbide  mononitrate (IMDUR ) 60 MG 24 hr tablet Take 1 tablet (60 mg total) by mouth daily.   Multiple Vitamin (MULTI-VITAMIN) tablet Take 1 tablet by mouth daily.   nitroGLYCERIN  (NITROSTAT ) 0.4 MG SL tablet Place 1 tablet (0.4 mg total) under the tongue every 5 (five) minutes as needed for chest pain.   NON FORMULARY Nitric Oxide Powder daily.   NON FORMULARY GreensBerry Takes powder daily.   Omega-3 Fatty Acids (OMEGA-3 2100) 1050 MG CAPS Take 1 tablet by mouth daily.   oxybutynin (DITROPAN-XL) 10 MG 24 hr tablet Take 10 mg by mouth at bedtime.   prasugrel  (EFFIENT ) 10 MG TABS tablet TAKE 1 TABLET BY MOUTH  DAILY   valsartan  (DIOVAN ) 80 MG tablet TAKE 1 TABLET BY MOUTH DAILY   "

## 2024-09-08 ENCOUNTER — Ambulatory Visit: Payer: Self-pay | Attending: Physician Assistant | Admitting: Physician Assistant

## 2024-09-08 ENCOUNTER — Encounter: Payer: Self-pay | Admitting: Physician Assistant

## 2024-09-08 VITALS — BP 130/90 | HR 73 | Ht 64.0 in | Wt 251.0 lb

## 2024-09-08 DIAGNOSIS — M791 Myalgia, unspecified site: Secondary | ICD-10-CM | POA: Diagnosis not present

## 2024-09-08 DIAGNOSIS — E785 Hyperlipidemia, unspecified: Secondary | ICD-10-CM

## 2024-09-08 DIAGNOSIS — Z789 Other specified health status: Secondary | ICD-10-CM

## 2024-09-08 DIAGNOSIS — I25118 Atherosclerotic heart disease of native coronary artery with other forms of angina pectoris: Secondary | ICD-10-CM | POA: Diagnosis not present

## 2024-09-08 DIAGNOSIS — T466X5S Adverse effect of antihyperlipidemic and antiarteriosclerotic drugs, sequela: Secondary | ICD-10-CM

## 2024-09-08 DIAGNOSIS — I10 Essential (primary) hypertension: Secondary | ICD-10-CM

## 2024-09-08 NOTE — Patient Instructions (Signed)
 Medication Instructions:  Your physician recommends that you continue on your current medications as directed. Please refer to the Current Medication list given to you today.   *If you need a refill on your cardiac medications before your next appointment, please call your pharmacy*   Follow-Up: At [facility], you and your health needs are our priority.  As part of our continuing mission to provide you with exceptional heart care, our providers are all part of one team.  This team includes your primary Cardiologist (physician) and Advanced Practice Providers or APPs (Physician Assistants and Nurse Practitioners) who all work together to provide you with the care you need, when you need it.  Your next appointment:   3 month(s)  Provider:   Bernardino Bring, PA-C    We recommend signing up for the patient portal called MyChart.  Sign up information is provided on this After Visit Summary.  MyChart is used to connect with patients for Virtual Visits (Telemedicine).  Patients are able to view lab/test results, encounter notes, upcoming appointments, etc.  Non-urgent messages can be sent to your provider as well.   To learn more about what you can do with MyChart, go to [email or web address].   Other Instructions

## 2024-12-12 ENCOUNTER — Ambulatory Visit: Payer: Self-pay | Admitting: Cardiovascular Disease
